# Patient Record
Sex: Male | Born: 1960 | Race: White | Hispanic: No | Marital: Married | Smoking: Never smoker
Health system: Southern US, Community
[De-identification: ages and names within clinical notes are randomized; demographics above are authoritative.]

## PROBLEM LIST (undated history)

## (undated) DIAGNOSIS — G4733 Obstructive sleep apnea (adult) (pediatric): Secondary | ICD-10-CM

## (undated) DIAGNOSIS — N529 Male erectile dysfunction, unspecified: Secondary | ICD-10-CM

## (undated) DIAGNOSIS — E7800 Pure hypercholesterolemia, unspecified: Secondary | ICD-10-CM

## (undated) DIAGNOSIS — F32A Depression, unspecified: Secondary | ICD-10-CM

## (undated) DIAGNOSIS — R5382 Chronic fatigue, unspecified: Secondary | ICD-10-CM

## (undated) DIAGNOSIS — M109 Gout, unspecified: Secondary | ICD-10-CM

## (undated) DIAGNOSIS — K7460 Unspecified cirrhosis of liver: Secondary | ICD-10-CM

## (undated) DIAGNOSIS — J383 Other diseases of vocal cords: Secondary | ICD-10-CM

## (undated) DIAGNOSIS — G8929 Other chronic pain: Secondary | ICD-10-CM

## (undated) DIAGNOSIS — K219 Gastro-esophageal reflux disease without esophagitis: Secondary | ICD-10-CM

## (undated) DIAGNOSIS — F909 Attention-deficit hyperactivity disorder, unspecified type: Secondary | ICD-10-CM

## (undated) DIAGNOSIS — F4310 Post-traumatic stress disorder, unspecified: Secondary | ICD-10-CM

## (undated) DIAGNOSIS — M549 Dorsalgia, unspecified: Secondary | ICD-10-CM

## (undated) HISTORY — DX: Other diseases of vocal cords: J38.3

## (undated) HISTORY — DX: Attention-deficit hyperactivity disorder, unspecified type: F90.9

## (undated) HISTORY — DX: Depression, unspecified: F32.A

## (undated) HISTORY — DX: Obstructive sleep apnea (adult) (pediatric): G47.33

## (undated) HISTORY — PX: LARYNGOSCOPY: SUR817

## (undated) HISTORY — PX: COLONOSCOPY: SHX174

## (undated) HISTORY — DX: Male erectile dysfunction, unspecified: N52.9

## (undated) HISTORY — DX: Chronic fatigue, unspecified: R53.82

## (undated) HISTORY — DX: Post-traumatic stress disorder, unspecified: F43.10

---

## 2007-04-11 ENCOUNTER — Ambulatory Visit (HOSPITAL_COMMUNITY): Admission: RE | Admit: 2007-04-11 | Discharge: 2007-04-11 | Payer: Self-pay | Admitting: Internal Medicine

## 2008-05-11 ENCOUNTER — Encounter: Payer: Self-pay | Admitting: Endocrinology

## 2008-06-15 ENCOUNTER — Encounter: Payer: Self-pay | Admitting: Endocrinology

## 2008-06-21 ENCOUNTER — Ambulatory Visit (HOSPITAL_COMMUNITY): Admission: RE | Admit: 2008-06-21 | Discharge: 2008-06-21 | Payer: Self-pay | Admitting: Pediatrics

## 2008-07-20 DIAGNOSIS — E785 Hyperlipidemia, unspecified: Secondary | ICD-10-CM | POA: Insufficient documentation

## 2008-07-20 DIAGNOSIS — J309 Allergic rhinitis, unspecified: Secondary | ICD-10-CM | POA: Insufficient documentation

## 2008-07-21 ENCOUNTER — Ambulatory Visit: Payer: Self-pay | Admitting: Endocrinology

## 2008-07-21 DIAGNOSIS — E291 Testicular hypofunction: Secondary | ICD-10-CM | POA: Insufficient documentation

## 2008-07-21 DIAGNOSIS — E230 Hypopituitarism: Secondary | ICD-10-CM | POA: Insufficient documentation

## 2009-08-17 ENCOUNTER — Emergency Department (HOSPITAL_COMMUNITY): Admission: EM | Admit: 2009-08-17 | Discharge: 2009-08-18 | Payer: Self-pay | Admitting: Emergency Medicine

## 2014-10-26 ENCOUNTER — Emergency Department (HOSPITAL_COMMUNITY): Payer: Self-pay

## 2014-10-26 ENCOUNTER — Emergency Department (HOSPITAL_COMMUNITY)
Admission: EM | Admit: 2014-10-26 | Discharge: 2014-10-26 | Disposition: A | Discharge status: Home / Self Care | Payer: Self-pay | Attending: Emergency Medicine | Admitting: Emergency Medicine

## 2014-10-26 ENCOUNTER — Encounter (HOSPITAL_COMMUNITY): Payer: Self-pay | Admitting: Emergency Medicine

## 2014-10-26 DIAGNOSIS — Z8719 Personal history of other diseases of the digestive system: Secondary | ICD-10-CM | POA: Insufficient documentation

## 2014-10-26 DIAGNOSIS — R000 Tachycardia, unspecified: Secondary | ICD-10-CM | POA: Insufficient documentation

## 2014-10-26 DIAGNOSIS — G8929 Other chronic pain: Secondary | ICD-10-CM | POA: Insufficient documentation

## 2014-10-26 DIAGNOSIS — Z8639 Personal history of other endocrine, nutritional and metabolic disease: Secondary | ICD-10-CM | POA: Insufficient documentation

## 2014-10-26 DIAGNOSIS — Z8739 Personal history of other diseases of the musculoskeletal system and connective tissue: Secondary | ICD-10-CM | POA: Insufficient documentation

## 2014-10-26 DIAGNOSIS — J40 Bronchitis, not specified as acute or chronic: Secondary | ICD-10-CM

## 2014-10-26 DIAGNOSIS — J209 Acute bronchitis, unspecified: Secondary | ICD-10-CM | POA: Insufficient documentation

## 2014-10-26 HISTORY — DX: Gout, unspecified: M10.9

## 2014-10-26 HISTORY — DX: Other chronic pain: G89.29

## 2014-10-26 HISTORY — DX: Dorsalgia, unspecified: M54.9

## 2014-10-26 HISTORY — DX: Gastro-esophageal reflux disease without esophagitis: K21.9

## 2014-10-26 HISTORY — DX: Pure hypercholesterolemia, unspecified: E78.00

## 2014-10-26 LAB — BASIC METABOLIC PANEL
ANION GAP: 9 (ref 5–15)
BUN: 17 mg/dL (ref 6–20)
CHLORIDE: 103 mmol/L (ref 101–111)
CO2: 28 mmol/L (ref 22–32)
CREATININE: 1.18 mg/dL (ref 0.61–1.24)
Calcium: 8.8 mg/dL — ABNORMAL LOW (ref 8.9–10.3)
GFR calc non Af Amer: >60 mL/min (ref >60)
Glucose, Bld: 132 mg/dL — ABNORMAL HIGH (ref 70–99)
POTASSIUM: 4.3 mmol/L (ref 3.5–5.1)
SODIUM: 140 mmol/L (ref 135–145)

## 2014-10-26 LAB — CBC WITH DIFFERENTIAL/PLATELET
BASOS PCT: 1 % (ref 0–1)
Basophils Absolute: 0.1 10*3/uL (ref 0.0–0.1)
EOS ABS: 0.1 10*3/uL (ref 0.0–0.7)
Eosinophils Relative: 3 % (ref 0–5)
HCT: 43.4 % (ref 39.0–52.0)
HEMOGLOBIN: 15.4 g/dL (ref 13.0–17.0)
LYMPHS ABS: 1.1 10*3/uL (ref 0.7–4.0)
LYMPHS PCT: 26 % (ref 12–46)
MCH: 31 pg (ref 26.0–34.0)
MCHC: 35.5 g/dL (ref 30.0–36.0)
MCV: 87.5 fL (ref 78.0–100.0)
MONOS PCT: 11 % (ref 3–12)
Monocytes Absolute: 0.4 10*3/uL (ref 0.1–1.0)
NEUTROS ABS: 2.4 10*3/uL (ref 1.7–7.7)
NEUTROS PCT: 59 % (ref 43–77)
PLATELETS: 187 10*3/uL (ref 150–400)
RBC: 4.96 MIL/uL (ref 4.22–5.81)
RDW: 12.7 % (ref 11.5–15.5)
WBC: 4.1 10*3/uL (ref 4.0–10.5)

## 2014-10-26 LAB — TROPONIN I: Troponin I: <0.03 ng/mL (ref <0.031)

## 2014-10-26 MED ORDER — PREDNISONE 20 MG PO TABS: 40.0000 mg | ORAL_TABLET | Freq: Every day | ORAL | Status: DC

## 2014-10-26 MED ORDER — ALBUTEROL SULFATE HFA 108 (90 BASE) MCG/ACT IN AERS
2.0000 | INHALATION_SPRAY | RESPIRATORY_TRACT | Status: AC
  Administered 2014-10-26: 2 via RESPIRATORY_TRACT
  Filled 2014-10-26: qty 6.7

## 2014-10-26 MED ORDER — BENZONATATE 100 MG PO CAPS: 100.0000 mg | ORAL_CAPSULE | Freq: Three times a day (TID) | ORAL | Status: DC

## 2014-10-26 MED ORDER — IPRATROPIUM-ALBUTEROL 0.5-2.5 (3) MG/3ML IN SOLN
3.0000 mL | Freq: Once | RESPIRATORY_TRACT | Status: AC
  Administered 2014-10-26: 3 mL via RESPIRATORY_TRACT
  Filled 2014-10-26: qty 3

## 2014-10-26 MED ORDER — BENZONATATE 100 MG PO CAPS
200.0000 mg | ORAL_CAPSULE | Freq: Once | ORAL | Status: AC
  Administered 2014-10-26: 200 mg via ORAL
  Filled 2014-10-26: qty 2

## 2014-10-26 MED ORDER — AZITHROMYCIN 250 MG PO TABS: 250.0000 mg | ORAL_TABLET | Freq: Every day | ORAL | Status: DC

## 2014-10-26 MED ORDER — KETOROLAC TROMETHAMINE 60 MG/2ML IM SOLN
60.0000 mg | Freq: Once | INTRAMUSCULAR | Status: AC
  Administered 2014-10-26: 60 mg via INTRAMUSCULAR
  Filled 2014-10-26: qty 2

## 2014-10-26 NOTE — ED Provider Notes (Signed)
CSN: [id number]     Arrival date & time 10/26/14  0603 History   First MD Initiated Contact with Patient 10/26/14 [phone]     Chief Complaint  Patient presents with  . Chest Pain  . 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 He reports approximately 2 days of cough, chest pain with the cough, fevers chills stuffy nose and a sore throat. He did get a flu shot this year, his cough has been occasionally productive, he has been taking over-the-counter medications with minimal relief.  Patient is a 54 y.o. male presenting with chest pain and shortness of breath. The history is provided by the patient.  Chest Pain Associated symptoms: shortness of breath   Shortness of Breath Associated symptoms: chest pain     Past Medical History  Diagnosis Date  . Hypercholesteremia   . Gout   . GERD (gastroesophageal reflux disease)   . Chronic back pain    History reviewed. No pertinent past surgical history. History reviewed. No pertinent family history. History  Substance Use Topics  . Smoking status: Never Smoker   . Smokeless tobacco: Not on file  . Alcohol Use: No    Review of Systems  Respiratory: Positive for shortness of breath.   Cardiovascular: Positive for chest pain.  All other systems reviewed and are negative.     Allergies  Review of patient's allergies indicates no known allergies.  Home Medications   Prior to Admission medications   Medication Sig Start Date End Date Taking? Authorizing Provider  azithromycin (ZITHROMAX Z-PAK) 250 MG tablet Take 1 tablet (250 mg total) by mouth daily. 500mg  PO day 1, then 250mg  PO days  205 10/26/14   Noemi Chapel, MD  benzonatate (TESSALON) 100 MG capsule Take 1 capsule (100 mg total) by mouth every 8 (eight) hours. 10/26/14   Noemi Chapel, MD  predniSONE (DELTASONE) 20 MG tablet Take 2 tablets (40 mg total) by mouth daily. 10/26/14   Noemi Chapel, MD   BP 141/81 mmHg  Pulse 115  Temp(Src) 98.3 F (36.8 C) (Oral)  Resp 17  Ht 5' 9.5" (1.765 m)  Wt 270 lb (122.471 kg)  BMI 39.31 kg/m2  SpO2 99% Physical Exam  Constitutional: He appears well-developed and well-nourished. No distress.  HENT:  Head: Normocephalic and atraumatic.  Mouth/Throat: Oropharynx is clear and moist. No oropharyngeal exudate.  Oropharynx erythematous, no exudate asymmetry or hypertrophy  Eyes: Conjunctivae and EOM are normal. Pupils are equal, round, and reactive to light. Right eye exhibits no discharge. Left eye exhibits no discharge. No scleral icterus.  Neck: Normal range of motion. Neck supple. No JVD present. No thyromegaly present.  No torticollis or lymphadenopathy  Cardiovascular: Regular rhythm, normal heart sounds and intact distal pulses.  Exam reveals no gallop and no friction rub.   No murmur heard. Mild tachycardia at 105 bpm  Pulmonary/Chest: Effort normal. No respiratory distress. He has wheezes (minimal expiratory wheezing). He has no rales.  Abdominal: Soft. Bowel sounds are normal. He exhibits no distension and no mass. There is no tenderness.  Musculoskeletal: Normal range of motion. He exhibits no edema or tenderness.  No edema o the lower extremities  Lymphadenopathy:    He has no cervical  adenopathy.  Neurological: He is alert. Coordination normal.  Skin: Skin is warm and dry. No rash noted. No erythema.  Psychiatric: He has a normal mood and affect. His behavior is normal.  Nursing note and vitals reviewed.   ED Course  Procedures (including critical care time) Labs Review Labs Reviewed  CBC WITH DIFFERENTIAL/PLATELET  BASIC METABOLIC PANEL  TROPONIN I     Imaging Review Dg Chest 2 View  10/26/2014   CLINICAL DATA:  Sharp chest pain and cough for 2 days.  EXAM: CHEST  2 VIEW  COMPARISON:  Chest radiograph April 11, 2007  FINDINGS: The cardiac silhouette is upper limits of normal in size, mediastinal silhouette is nonsuspicious. Similar bronchitic changes without pleural effusion or focal consolidation. No pneumothorax. Soft tissue planes and included osseous structures are nonsuspicious.  IMPRESSION: Borderline cardiomegaly, similar mild bronchitic changes without focal consolidation.   Electronically Signed   By: Elon Alas   On: 10/26/2014 06:54     EKG Interpretation   Date/Time:  Tuesday Oct 26 2014 06:13:12 EDT Ventricular Rate:  97 PR Interval:  157 QRS Duration: 88 QT Interval:  334 QTC Calculation: 424 R Axis:   -93 Text Interpretation:  Sinus rhythm Left anterior fascicular block Low  voltage, precordial leads Abnormal R-wave progression, late transition No  old tracing to compare Confirmed by KNAPP  MD-I, IVA ([id number]) on 10/26/2014  6:23:25 AM      MDM   Final diagnoses:  Bronchitis    Labs without leukocytosis CXR witho ut infiltrate Improved with neb Home with meds for sx control of bronchitis.  Pt in agreement with plan.  Meds given in ED:  Medications  benzonatate (TESSALON) capsule 200 mg (not administered)  albuterol (PROVENTIL HFA;VENTOLIN HFA) 108 (90 BASE) MCG/ACT inhaler 2 puff (not administered)  ketorolac (TORADOL) injection 60 mg (not administered)  ipratropium-albuterol (DUONEB) 0.5-2.5 (3) MG/3ML nebulizer solution 3 mL (3 mLs Nebulization Given 10/26/14 0651)    New Prescriptions   AZITHROMYCIN (ZITHROMAX Z-PAK) 250 MG TABLET    Take 1 tablet (250 mg total) by mouth daily. 500mg  PO day 1, then 250mg  PO days 205   BENZONATATE (TESSALON) 100 MG CAPSULE    Take 1 capsule (100 mg total) by mouth every 8 (eight) hours.   PREDNISONE (DELTASONE) 20 MG TABLET    Take 2 tablets (40 mg total)  by mouth daily.       Noemi Chapel, MD 10/26/14 [id number]

## 2014-10-26 NOTE — Discharge Instructions (Signed)
2 puffs of albuterol every 4 hours as needed Prednisone once daly for 5 days Tessalon for cough Zithromax  Please call your doctor for a followup appointment within 24-48 hours. When you talk to your doctor please let them know that you were seen in the emergency department and have them acquire all of your records so that they can discuss the findings with you and formulate a treatment plan to fully care for your new and ongoing problems.

## 2014-10-26 NOTE — ED Notes (Signed)
Patient complaining of sharp central chest pains, cough, and shortness of breath that started last night. Reports productive cough with yellow/Alexander Gutierrez sputum.

## 2014-10-26 NOTE — ED Notes (Signed)
MD at bedside. 

## 2019-02-16 ENCOUNTER — Emergency Department (HOSPITAL_COMMUNITY)
Admission: EM | Admit: 2019-02-16 | Discharge: 2019-02-16 | Disposition: A | Discharge status: Home / Self Care | Payer: No Typology Code available for payment source | Attending: Emergency Medicine | Admitting: Emergency Medicine

## 2019-02-16 ENCOUNTER — Encounter (HOSPITAL_COMMUNITY): Payer: Self-pay | Admitting: Emergency Medicine

## 2019-02-16 ENCOUNTER — Other Ambulatory Visit: Payer: Self-pay

## 2019-02-16 DIAGNOSIS — G8929 Other chronic pain: Secondary | ICD-10-CM

## 2019-02-16 DIAGNOSIS — M545 Low back pain: Secondary | ICD-10-CM | POA: Insufficient documentation

## 2019-02-16 DIAGNOSIS — Z79899 Other long term (current) drug therapy: Secondary | ICD-10-CM | POA: Diagnosis not present

## 2019-02-16 MED ORDER — HYDROCODONE-ACETAMINOPHEN 5-325 MG PO TABS: 1.0000 | ORAL_TABLET | Freq: Four times a day (QID) | ORAL | 0 refills | Status: DC | PRN

## 2019-02-16 MED ORDER — CYCLOBENZAPRINE HCL 10 MG PO TABS: 10.0000 mg | ORAL_TABLET | Freq: Two times a day (BID) | ORAL | 0 refills | Status: DC | PRN

## 2019-02-16 NOTE — ED Triage Notes (Signed)
Pt states he was getting out of the shower and was drying off and felt a pop in his back and had sudden pain. Went to chiropractor this morning as he goes every week and pain became worse. Had this happen about a year ago as well.

## 2019-02-16 NOTE — Discharge Instructions (Signed)
Please read instructions below.  You can take ibuprofen every 6 hours as needed for mild to moderate back pain. You can take hydrocodone every 6 hours as needed for more severe back pain. Be aware there is tylenol in this medication. This medication can make you drowsy. Apply ice to your back for 20 minutes at a time.  You can also apply heat if this provides more relief.   You can take Flexeril/cyclobenzaprine every 12 hours as needed for muscle spasm.  Be aware this medication can make you drowsy; do not take while driving or drinking alcohol.   Follow-up with your primary care provider symptoms persist.   Return to ER if new numbness or tingling in your arms or legs, inability to urinate, inability to hold your bowels, or weakness in your extremities.

## 2019-02-16 NOTE — ED Provider Notes (Signed)
[facility] EMERGENCY DEPARTMENT Provider Note   CSN: [id number] Arrival date & time: 02/16/19  1009     History   Chief Complaint Chief Complaint  Patient presents with  . Back Pain    HPI Alexander Gutierrez is a 58 y.o. male with past medical history of chronic back pain, GERD, hyperlipidemia, presenting to the emergency department with bilateral low back pain that began yesterday.  Patient states he has been having intermittent issues with low back pain for multiple years now.  He states sometimes he will just get sharp shooting pains with particular movements.  Sometimes he will have more of a flare of his back pain where it bothers him for multiple days.  He states yesterday he was getting out of the shower and drying off when he felt a popping sensation in his back with a sudden similar pain.  He describes his pain as sharp, shooting, bilateral lower back.  It is worse with movement.  He states it did not feel any different from prior episodes of back pain.  He denies associated abdominal pain, urinary symptoms, bowel or bladder incontinence, saddle paresthesias, fever.     The history is provided by the patient.    Past Medical History:  Diagnosis Date  . Chronic back pain   . GERD (gastroesophageal reflux disease)   . Gout   . Hypercholesteremia     Patient Active Problem List   Diagnosis Date Noted  . 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 HYPOGONADISM 07/21/2008  . HYPERLIPIDEMIA 07/20/2008  . ALLERGIC RHINITIS 07/20/2008    History reviewed. No pertinent surgical history.      Home Medications    Prior to Admission medications   Medication Sig Start Date End Date Taking? Authorizing Provider  azithromycin (ZITHROMAX Z-PAK) 250 MG tablet Take 1 tablet (250 mg total) by mouth daily. 500mg  PO day 1, then 250mg  PO days 205 10/26/14   Noemi Chapel, MD  benzonatate (TESSALON) 100 MG capsule Take 1 capsule (100 mg total) by mouth every 8 (eight) hours. 10/26/14   Noemi Chapel, MD  cyclobenzaprine (FLEXERIL) 10 MG tablet Take 1 tablet (10 mg total) by mouth 2 (two) times daily as needed for muscle spasms. 02/16/19   Robinson, [place] N, PA-C  HYDROcodone-acetaminophen (NORCO/VICODIN) 5-325 MG tablet Take 1-2 tablets by mouth every 6 (six) hours as needed for severe pain. 02/16/19   Robinson, [place] N, PA-C  predniSONE (DELTASONE) 20 MG tablet Take 2 tablets (40 mg total) by mouth daily. 10/26/14   Noemi Chapel, MD    Family History History reviewed. No pertinent family history.  Social History Social History   Tobacco Use  . Smoking status: Never Smoker  Substance Use Topics  . Alcohol use: No  . Drug use: No     Allergies   Patient has no known allergies.   Review of Systems Review of Systems  Constitutional: Negative for fever.  Gastrointestinal: Negative for abdominal pain.  Genitourinary: Negative for difficulty urinating, dysuria and frequency.  Musculoskeletal: Positive for back pain.  All other systems reviewed and are negative.    Physical Exam Updated Vital Signs BP 124/69 (BP Location: Right Arm)   Pulse 79   Temp 98.5 F (36.9 C) (Oral)   Resp 18   Ht 5\' 10"  (1.778 m)   Wt 127 kg   SpO2 98%   BMI 40.18 kg/m   Physical Exam Vitals signs and nursing note reviewed.  Constitutional:      General: He is  not in acute distress.    Appearance: He is well-developed. He is not ill-appearing.  HENT:     Head: Normocephalic and atraumatic.  Eyes:     Conjunctiva/sclera: Conjunctivae normal.  Cardiovascular:     Rate and Rhythm: Normal rate and regular rhythm.  Pulmonary:     Effort: Pulmonary effort is normal. No respiratory distress.     Breath sounds: Normal breath sounds.  Abdominal:     General: Bowel sounds are normal.     Palpations: Abdomen is soft.     Tenderness: There is no abdominal tenderness. There is no guarding or rebound.  Musculoskeletal:     Comments: There is slight tenderness with generalized palpation  of the bilateral lower back.  No skin changes.  No bony step-offs or gross deformities of the spine.  Spontaneous moving all extremities without difficulty.  Skin:    General: Skin is warm.  Neurological:     Mental Status: He is alert.     Comments: Normal tone.  5/5 strength in BUE and BLE including strong and equal grip strength and dorsiflexion/plantar flexion Sensory: Pinprick and light touch normal in BLE extremities.  Gait: normal gait and balance (using a cane for support) CV: distal pulses palpable throughout    Psychiatric:        Behavior: Behavior normal.      ED Treatments / Results  Labs (all labs ordered are listed, but only abnormal results are displayed) Labs Reviewed - No data to display  EKG None  Radiology No results found.  Procedures Procedures (including critical care time)  Medications Ordered in ED Medications - No data to display   Initial Impression / Assessment and Plan / ED Course  I have reviewed the triage vital signs and the nursing notes.  Pertinent labs & imaging results that were available during my care of the patient were reviewed by me and considered in my medical decision making (see chart for details).        Patient with history of intermittent chronic low back pain, presenting with similar back pain flare since yesterday.   No neurological deficits and normal neuro exam.  Patient can walk but states is painful.  No loss of bowel or bladder control.  No concern for cauda equina.  RICE protocol and pain medicine indicated and discussed with patient.   Discussed results, findings, treatment and follow up. Patient advised of return precautions. Patient verbalized understanding and agreed with plan.  [place] Controlled Substance reporting System queried   Final Clinical Impressions(s) / ED Diagnoses   Final diagnoses:  Chronic bilateral low back pain without sciatica    ED Discharge Orders         Ordered     HYDROcodone-acetaminophen (NORCO/VICODIN) 5-325 MG tablet  Every 6 hours PRN     02/16/19 1239    cyclobenzaprine (FLEXERIL) 10 MG tablet  2 times daily PRN     02/16/19 1239           Robinson, [place] N, PA-C 02/16/19 [place], [place], DO 02/19/19 1546

## 2020-03-21 DIAGNOSIS — S338XXA Sprain of other parts of lumbar spine and pelvis, initial encounter: Secondary | ICD-10-CM | POA: Diagnosis not present

## 2020-03-21 DIAGNOSIS — M9902 Segmental and somatic dysfunction of thoracic region: Secondary | ICD-10-CM | POA: Diagnosis not present

## 2020-03-21 DIAGNOSIS — S233XXA Sprain of ligaments of thoracic spine, initial encounter: Secondary | ICD-10-CM | POA: Diagnosis not present

## 2020-03-21 DIAGNOSIS — M9901 Segmental and somatic dysfunction of cervical region: Secondary | ICD-10-CM | POA: Diagnosis not present

## 2020-03-22 DIAGNOSIS — M79674 Pain in right toe(s): Secondary | ICD-10-CM | POA: Diagnosis not present

## 2020-03-22 DIAGNOSIS — L600 Ingrowing nail: Secondary | ICD-10-CM | POA: Diagnosis not present

## 2020-03-22 DIAGNOSIS — L089 Local infection of the skin and subcutaneous tissue, unspecified: Secondary | ICD-10-CM | POA: Diagnosis not present

## 2020-03-29 DIAGNOSIS — M9901 Segmental and somatic dysfunction of cervical region: Secondary | ICD-10-CM | POA: Diagnosis not present

## 2020-03-29 DIAGNOSIS — S233XXA Sprain of ligaments of thoracic spine, initial encounter: Secondary | ICD-10-CM | POA: Diagnosis not present

## 2020-03-29 DIAGNOSIS — S338XXA Sprain of other parts of lumbar spine and pelvis, initial encounter: Secondary | ICD-10-CM | POA: Diagnosis not present

## 2020-03-29 DIAGNOSIS — M9902 Segmental and somatic dysfunction of thoracic region: Secondary | ICD-10-CM | POA: Diagnosis not present

## 2020-04-05 DIAGNOSIS — M9902 Segmental and somatic dysfunction of thoracic region: Secondary | ICD-10-CM | POA: Diagnosis not present

## 2020-04-05 DIAGNOSIS — M9901 Segmental and somatic dysfunction of cervical region: Secondary | ICD-10-CM | POA: Diagnosis not present

## 2020-04-05 DIAGNOSIS — S233XXA Sprain of ligaments of thoracic spine, initial encounter: Secondary | ICD-10-CM | POA: Diagnosis not present

## 2020-04-05 DIAGNOSIS — S338XXA Sprain of other parts of lumbar spine and pelvis, initial encounter: Secondary | ICD-10-CM | POA: Diagnosis not present

## 2020-04-12 DIAGNOSIS — M9901 Segmental and somatic dysfunction of cervical region: Secondary | ICD-10-CM | POA: Diagnosis not present

## 2020-04-12 DIAGNOSIS — S233XXA Sprain of ligaments of thoracic spine, initial encounter: Secondary | ICD-10-CM | POA: Diagnosis not present

## 2020-04-12 DIAGNOSIS — S338XXA Sprain of other parts of lumbar spine and pelvis, initial encounter: Secondary | ICD-10-CM | POA: Diagnosis not present

## 2020-04-12 DIAGNOSIS — M9902 Segmental and somatic dysfunction of thoracic region: Secondary | ICD-10-CM | POA: Diagnosis not present

## 2020-04-19 DIAGNOSIS — S233XXA Sprain of ligaments of thoracic spine, initial encounter: Secondary | ICD-10-CM | POA: Diagnosis not present

## 2020-04-19 DIAGNOSIS — M9902 Segmental and somatic dysfunction of thoracic region: Secondary | ICD-10-CM | POA: Diagnosis not present

## 2020-04-19 DIAGNOSIS — M9901 Segmental and somatic dysfunction of cervical region: Secondary | ICD-10-CM | POA: Diagnosis not present

## 2020-04-19 DIAGNOSIS — S338XXA Sprain of other parts of lumbar spine and pelvis, initial encounter: Secondary | ICD-10-CM | POA: Diagnosis not present

## 2020-04-27 DIAGNOSIS — S338XXA Sprain of other parts of lumbar spine and pelvis, initial encounter: Secondary | ICD-10-CM | POA: Diagnosis not present

## 2020-04-27 DIAGNOSIS — M9902 Segmental and somatic dysfunction of thoracic region: Secondary | ICD-10-CM | POA: Diagnosis not present

## 2020-04-27 DIAGNOSIS — S233XXA Sprain of ligaments of thoracic spine, initial encounter: Secondary | ICD-10-CM | POA: Diagnosis not present

## 2020-04-27 DIAGNOSIS — M9901 Segmental and somatic dysfunction of cervical region: Secondary | ICD-10-CM | POA: Diagnosis not present

## 2020-05-04 DIAGNOSIS — S233XXA Sprain of ligaments of thoracic spine, initial encounter: Secondary | ICD-10-CM | POA: Diagnosis not present

## 2020-05-04 DIAGNOSIS — S338XXA Sprain of other parts of lumbar spine and pelvis, initial encounter: Secondary | ICD-10-CM | POA: Diagnosis not present

## 2020-05-04 DIAGNOSIS — M9902 Segmental and somatic dysfunction of thoracic region: Secondary | ICD-10-CM | POA: Diagnosis not present

## 2020-05-04 DIAGNOSIS — M9901 Segmental and somatic dysfunction of cervical region: Secondary | ICD-10-CM | POA: Diagnosis not present

## 2020-05-11 DIAGNOSIS — S338XXA Sprain of other parts of lumbar spine and pelvis, initial encounter: Secondary | ICD-10-CM | POA: Diagnosis not present

## 2020-05-11 DIAGNOSIS — M9902 Segmental and somatic dysfunction of thoracic region: Secondary | ICD-10-CM | POA: Diagnosis not present

## 2020-05-11 DIAGNOSIS — M9901 Segmental and somatic dysfunction of cervical region: Secondary | ICD-10-CM | POA: Diagnosis not present

## 2020-05-11 DIAGNOSIS — S233XXA Sprain of ligaments of thoracic spine, initial encounter: Secondary | ICD-10-CM | POA: Diagnosis not present

## 2020-05-13 DIAGNOSIS — J209 Acute bronchitis, unspecified: Secondary | ICD-10-CM | POA: Diagnosis not present

## 2020-05-13 DIAGNOSIS — J069 Acute upper respiratory infection, unspecified: Secondary | ICD-10-CM | POA: Diagnosis not present

## 2020-05-16 ENCOUNTER — Encounter (HOSPITAL_COMMUNITY): Payer: Self-pay

## 2020-05-16 ENCOUNTER — Emergency Department (HOSPITAL_COMMUNITY): Payer: No Typology Code available for payment source

## 2020-05-16 ENCOUNTER — Emergency Department (HOSPITAL_COMMUNITY)
Admission: EM | Admit: 2020-05-16 | Discharge: 2020-05-16 | Disposition: A | Discharge status: Home / Self Care | Payer: No Typology Code available for payment source | Attending: Emergency Medicine | Admitting: Emergency Medicine

## 2020-05-16 ENCOUNTER — Other Ambulatory Visit: Payer: Self-pay

## 2020-05-16 DIAGNOSIS — U071 COVID-19: Secondary | ICD-10-CM | POA: Insufficient documentation

## 2020-05-16 DIAGNOSIS — R0602 Shortness of breath: Secondary | ICD-10-CM | POA: Diagnosis present

## 2020-05-16 LAB — BASIC METABOLIC PANEL
Anion gap: 10 (ref 5–15)
BUN: 22 mg/dL — ABNORMAL HIGH (ref 6–20)
CO2: 26 mmol/L (ref 22–32)
Calcium: 8.8 mg/dL — ABNORMAL LOW (ref 8.9–10.3)
Chloride: 100 mmol/L (ref 98–111)
Creatinine, Ser: 1.24 mg/dL (ref 0.61–1.24)
GFR, Estimated: >60 mL/min (ref >60)
Glucose, Bld: 101 mg/dL — ABNORMAL HIGH (ref 70–99)
Potassium: 4.4 mmol/L (ref 3.5–5.1)
Sodium: 136 mmol/L (ref 135–145)

## 2020-05-16 LAB — CBC WITH DIFFERENTIAL/PLATELET
Abs Immature Granulocytes: 0.05 10*3/uL (ref 0.00–0.07)
Basophils Absolute: 0.1 10*3/uL (ref 0.0–0.1)
Basophils Relative: 1 %
Eosinophils Absolute: 0.2 10*3/uL (ref 0.0–0.5)
Eosinophils Relative: 2 %
HCT: 43.3 % (ref 39.0–52.0)
Hemoglobin: 15.1 g/dL (ref 13.0–17.0)
Immature Granulocytes: 1 %
Lymphocytes Relative: 36 %
Lymphs Abs: 2.7 10*3/uL (ref 0.7–4.0)
MCH: 31.1 pg (ref 26.0–34.0)
MCHC: 34.9 g/dL (ref 30.0–36.0)
MCV: 89.3 fL (ref 80.0–100.0)
Monocytes Absolute: 0.7 10*3/uL (ref 0.1–1.0)
Monocytes Relative: 9 %
Neutro Abs: 3.8 10*3/uL (ref 1.7–7.7)
Neutrophils Relative %: 51 %
Platelets: 241 10*3/uL (ref 150–400)
RBC: 4.85 MIL/uL (ref 4.22–5.81)
RDW: 12.4 % (ref 11.5–15.5)
WBC: 7.4 10*3/uL (ref 4.0–10.5)
nRBC: 0 % (ref 0.0–0.2)

## 2020-05-16 LAB — TROPONIN I (HIGH SENSITIVITY): Troponin I (High Sensitivity): 2 ng/L (ref <18)

## 2020-05-16 MED ORDER — ALBUTEROL SULFATE HFA 108 (90 BASE) MCG/ACT IN AERS
2.0000 | INHALATION_SPRAY | RESPIRATORY_TRACT | Status: DC | PRN
  Administered 2020-05-16: 2 via RESPIRATORY_TRACT
  Filled 2020-05-16: qty 6.7

## 2020-05-16 MED ORDER — IOHEXOL 350 MG/ML SOLN
100.0000 mL | Freq: Once | INTRAVENOUS | Status: AC | PRN
  Administered 2020-05-16: 100 mL via INTRAVENOUS

## 2020-05-16 MED ORDER — SODIUM CHLORIDE 0.9 % IV BOLUS
500.0000 mL | Freq: Once | INTRAVENOUS | Status: AC
  Administered 2020-05-16: 500 mL via INTRAVENOUS

## 2020-05-16 MED ORDER — AEROCHAMBER PLUS FLO-VU MISC
1.0000 | Freq: Once | Status: AC
  Administered 2020-05-16: 1
  Filled 2020-05-16: qty 1

## 2020-05-16 NOTE — ED Provider Notes (Signed)
[facility] EMERGENCY DEPARTMENT Provider Note   CSN: [id number] Arrival date & time: 05/16/20  1923     History Chief Complaint  Patient presents with  . Shortness of Breath    [name] is a 59 y.o. male history of obesity, chronic pain, GERD, gout, hypercholesterolemia.  Patient presents to the ER today for concern of pulmonary embolism.  He reports he was diagnosed with COVID-19 with 2 days ago, he began having symptoms of Covid 7 days ago including cough, loss of taste and smell and generalized fatigue.  If he reports that he was seen at the [place] earlier today and received immunoglobulin, he did not have any blood work or imaging performed and he returned home around noon.  He took a nap when he woke up he had a strong coughing fit and noticed large amount of blood clots in his sputum which concerned him.  He reports some shortness of breath with coughing but no shortness of breath when he is not coughing.  Denies fever/chills, headache, neck stiffness, chest pain, abdominal pain, nausea/vomiting/diarrhea, extremity swelling/color change, history of blood clot or any additional concerns.  HPI     Past Medical History:  Diagnosis Date  . Chronic back pain   . GERD (gastroesophageal reflux disease)   . Gout   . Hypercholesteremia     Patient Active Problem List   Diagnosis Date Noted  . 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 HYPOGONADISM 07/21/2008  . HYPERLIPIDEMIA 07/20/2008  . ALLERGIC RHINITIS 07/20/2008    History reviewed. No pertinent surgical history.     History reviewed. No pertinent family history.  Social History   Tobacco Use  . Smoking status: Never Smoker  . Smokeless tobacco: Never Used  Substance Use Topics  . Alcohol use: No  . Drug use: No    Home Medications Prior to Admission medications   Medication Sig Start Date End Date Taking? Authorizing Provider  azithromycin (ZITHROMAX Z-PAK) 250 MG tablet Take 1 tablet (250 mg total) by  mouth daily. 500mg  PO day 1, then 250mg  PO days 205 10/26/14   Noemi Chapel, MD  benzonatate (TESSALON) 100 MG capsule Take 1 capsule (100 mg total) by mouth every 8 (eight) hours. 10/26/14   Noemi Chapel, MD  cyclobenzaprine (FLEXERIL) 10 MG tablet Take 1 tablet (10 mg total) by mouth 2 (two) times daily as needed for muscle spasms. 02/16/19   Robinson, [place] N, PA-C  HYDROcodone-acetaminophen (NORCO/VICODIN) 5-325 MG tablet Take 1-2 tablets by mouth every 6 (six) hours as needed for severe pain. 02/16/19   Robinson, [place] N, PA-C  predniSONE (DELTASONE) 20 MG tablet Take 2 tablets (40 mg total) by mouth daily. 10/26/14   Noemi Chapel, MD    Allergies    Patient has no known allergies.  Review of Systems   Review of Systems Ten systems are reviewed and are negative for acute change except as noted in the HPI  Physical Exam Updated Vital Signs BP 120/77 (BP Location: Right Arm)   Pulse 82   Temp 97.8 F (36.6 C) (Oral)   Resp 18   Ht 5\' 10"  (1.778 m)   Wt 124.7 kg   SpO2 97%   BMI 39.46 kg/m   Physical Exam Constitutional:      General: He is not in acute distress.    Appearance: Normal appearance. He is well-developed. He is not ill-appearing or diaphoretic.  HENT:     Head: Normocephalic and atraumatic.  Eyes:     General: Vision  grossly intact. Gaze aligned appropriately.     Pupils: Pupils are equal, round, and reactive to light.  Neck:     Trachea: Trachea and phonation normal.  Cardiovascular:     Rate and Rhythm: Normal rate and regular rhythm.  Pulmonary:     Effort: Pulmonary effort is normal. No accessory muscle usage or respiratory distress.     Breath sounds: Normal breath sounds.  Abdominal:     General: There is no distension.     Palpations: Abdomen is soft.     Tenderness: There is no abdominal tenderness. There is no guarding or rebound.  Musculoskeletal:        General: Normal range of motion.     Cervical back: Normal range of motion.     Right  lower leg: No tenderness. No edema.     Left lower leg: No tenderness. No edema.  Skin:    General: Skin is warm and dry.  Neurological:     Mental Status: He is alert.     GCS: GCS eye subscore is 4. GCS verbal subscore is 5. GCS motor subscore is 6.     Comments: Speech is clear and goal oriented, follows commands Major Cranial nerves without deficit, no facial droop Moves extremities without ataxia, coordination intact  Psychiatric:        Behavior: Behavior normal.     ED Results / Procedures / Treatments   Labs (all labs ordered are listed, but only abnormal results are displayed) Labs Reviewed  BASIC METABOLIC PANEL - Abnormal; Notable for the following components:      Result Value   Glucose, Bld 101 (*)    BUN 22 (*)    Calcium 8.8 (*)    All other components within normal limits  CBC WITH DIFFERENTIAL/PLATELET  CBC WITH DIFFERENTIAL/PLATELET  TROPONIN I (HIGH SENSITIVITY)    EKG EKG Interpretation  Date/Time:  Monday May 16 2020 20:54:00 EST Ventricular Rate:  70 PR Interval:  178 QRS Duration: 86 QT Interval:  406 QTC Calculation: 438 R Axis:   -107 Text Interpretation: Normal sinus rhythm Right superior axis deviation Low voltage QRS Abnormal ECG Since last tracing QRS voltage is lower Confirmed by Calvert Cantor [phone]) on 05/16/2020 9:09:08 PM   Radiology CT Angio Chest PE W and/or Wo Contrast  Result Date: 05/16/2020 CLINICAL DATA:  Shortness of breath, possible COVID-19, presenting from the [place] following hemoptysis EXAM: CT ANGIOGRAPHY CHEST WITH CONTRAST TECHNIQUE: Multidetector CT imaging of the chest was performed using the standard protocol during bolus administration of intravenous contrast. Multiplanar CT image reconstructions and MIPs were obtained to evaluate the vascular anatomy. CONTRAST:  124mL OMNIPAQUE IOHEXOL 350 MG/ML SOLN COMPARISON:  Radiograph 05/16/2020 FINDINGS: Cardiovascular: Satisfactory opacification the pulmonary arteries to  the segmental level. No pulmonary artery filling defects are identified. Central pulmonary arteries are normal caliber. Normal heart size. No pericardial effusion. The aorta is normal caliber. Normal 3 vessel branching of the aortic arch. Proximal great vessels are unremarkable. Upper abdominal branches are unremarkable aside from a diminutive accessory left upper pole renal artery. Mediastinum/Nodes: No mediastinal fluid or gas. Normal thyroid gland and thoracic inlet. No acute abnormality of the trachea or esophagus. No worrisome mediastinal, hilar or axillary adenopathy. Lungs/Pleura: Mild airways thickening is seen diffusely. No consolidation, features of edema, pneumothorax, or effusion. No suspicious pulmonary nodules or masses. Upper Abdomen: 1.7 cm fluid attenuation cyst seen in the anterior left lobe liver. No concerning or worrisome features. Diffuse hepatic hypoattenuation compatible with  hepatic steatosis. Sparing along the gallbladder fossa. Mild bilateral perinephric stranding, nonspecific. Musculoskeletal: No acute osseous abnormality or suspicious osseous lesion. Multilevel degenerative changes are present in the imaged portions of the spine. Mild degenerative changes in the bilateral shoulders. No worrisome chest wall masses. Mild bilateral gynecomastia. Review of the MIP images confirms the above findings. IMPRESSION: 1. No evidence of pulmonary embolism. 2. Mild airways thickening, could reflect acute or chronic bronchitis versus reactive airways disease. 3. No other acute intrathoracic process. 4. Hepatic steatosis. 5. Mild bilateral perinephric stranding, nonspecific and possibly related to age or diminished renal function though can be seen in the setting of urinary tract infection. Correlate for urinary symptoms and consider urinalysis as clinically warranted. Electronically Signed   By: Lovena Le M.D.   On: 05/16/2020 22:56   DG Chest Port 1 View  Result Date: 05/16/2020 CLINICAL  DATA:  Short of breath, cough, mop CIS, COVID-19 positive EXAM: PORTABLE CHEST 1 VIEW COMPARISON:  10/26/2014 FINDINGS: The heart size and mediastinal contours are within normal limits. Both lungs are clear. The visualized skeletal structures are unremarkable. IMPRESSION: No active disease. Electronically Signed   By: Randa Ngo M.D.   On: 05/16/2020 20:26    Procedures Procedures (including critical care time)  Medications Ordered in ED Medications  albuterol (VENTOLIN HFA) 108 (90 Base) MCG/ACT inhaler 2 puff (has no administration in time range)  aerochamber plus with mask device 1 each (has no administration in time range)  sodium chloride 0.9 % bolus 500 mL (500 mLs Intravenous New Bag/Given (Non-Interop) 05/16/20 2202)  iohexol (OMNIPAQUE) 350 MG/ML injection 100 mL (100 mLs Intravenous Contrast Given 05/16/20 2236)    ED Course  I have reviewed the triage vital signs and the nursing notes.  Pertinent labs & imaging results that were available during my care of the patient were reviewed by me and considered in my medical decision making (see chart for details).    MDM Rules/Calculators/A&P                         Additional history obtained from: 1. Nursing notes from this visit. 2. Review of electronic medical records.  [facility]. ---------------------- 59 year old male presented today for concern of shortness of breath and hemoptysis.  He is on day 7 of COVID-19 viral infection diagnosed at [facility] 2 days ago.  A he reports a large amount of blood in his sputum after receiving the immunoglobulin therapy at the [place] today.  He has no chest pain tachycardia or hypoxia on room air.  Patient is very concerned about possible PE and is requesting imaging today, shared decision making made with patient, given he is Covid positive and with hemoptysis today will obtain CT angio PE study. - I ordered, reviewed and interpreted labs which include: CBC without  leukocytosis to suggest bacterial infection, no anemia. High-sensitivity troponin within normal limits, as patient is without chest pain and symptoms have been ongoing for many days no indication for delta troponin. BMP shows no emergent electrolyte derangement, AKI or gap.  Chest x-ray IMPRESSION:  No active disease.   CT Angio PE Study:  IMPRESSION:  1. No evidence of pulmonary embolism.  2. Mild airways thickening, could reflect acute or chronic  bronchitis versus reactive airways disease.  3. No other acute intrathoracic process.  4. Hepatic steatosis.  5. Mild bilateral perinephric stranding, nonspecific and possibly  related to age or diminished renal function though can be  seen in  the setting of urinary tract infection. Correlate for urinary  symptoms and consider urinalysis as clinically warranted.   EKG: Normal sinus rhythm Right superior axis deviation Low voltage QRS Abnormal ECG Since last tracing QRS voltage is lower Confirmed by Calvert Cantor [phone]) on 05/16/2020 9:09:08 PM - Patient ambulated by nursing staff without hypoxia on room air.  Patient reassessed he is resting comfortably no acute distress reports he is feeling well and is requesting discharge.  Patient updated on findings above including incidentals and states understanding and will follow up with his PCP.  Patient was given albuterol inhaler and spacer for likely bronchitis, no indication for further work-up or treatment at this time.  He received immunoglobulin earlier today by the [place].  Advised strict return precautions, advised patient obtain a home pulse oximeter and monitor his symptoms closely.  At this time there does not appear to be any evidence of an acute emergency medical condition and the patient appears stable for discharge with appropriate outpatient follow up. Diagnosis was discussed with patient who verbalizes understanding of care plan and is agreeable to discharge. I have discussed return  precautions with patient who verbalizes understanding. Patient encouraged to follow-up with their PCP. All questions answered.  Patient's case discussed with Dr. Karle Starch who agrees with plan to discharge with follow-up.   Alexander Gutierrez was evaluated in Emergency Department on 05/16/2020 for the symptoms described in the history of present illness. He was evaluated in the context of the global COVID-19 pandemic, which necessitated consideration that the patient might be at risk for infection with the SARS-CoV-2 virus that causes COVID-19. Institutional protocols and algorithms that pertain to the evaluation of patients at risk for COVID-19 are in a state of rapid change based on information released by regulatory bodies including the CDC and federal and state organizations. These policies and algorithms were followed during the patient's care in the ED.  Note: Portions of this report may have been transcribed using voice recognition software. Every effort was made to ensure accuracy; however, inadvertent computerized transcription errors may still be present. Final Clinical Impression(s) / ED Diagnoses Final diagnoses:  [id number]    Rx / DC Orders ED Discharge Orders    None       Gari Crown 05/16/20 2307    Truddie Hidden, MD 05/17/20 [phone]

## 2020-05-16 NOTE — ED Notes (Signed)
97% after ambulating  

## 2020-05-16 NOTE — ED Triage Notes (Signed)
Pt to er, pt states that he is here for covid, states that he was seen at the [place] earlier today and given a treatment, states that he is here because after he coughed up some blood, states that he was vaccinated.  Pt states that he has some sob, pt talking in full sentences.

## 2020-05-16 NOTE — Discharge Instructions (Addendum)
At this time there does not appear to be the presence of an emergent medical condition, however there is always the potential for conditions to change. Please read and follow the below instructions.  Please return to the Emergency Department immediately for any new or worsening symptoms. Please be sure to follow up with your Primary Care Provider within one week regarding your visit today; please call their office to schedule an appointment even if you are feeling better for a follow-up visit. You may use the albuterol inhaler and spacer given to you today as needed for any wheezing and shortness of breath you experience.  If you feel this medication is not helping return to the ER immediately for evaluation. You may buy a pulse oximeter from any drugstore or from [employer] and monitor your oxygen levels at home.  If ever drops below 90% please return to the emergency department for evaluation. Your CT scan today showed some thickening of your airways which is likely due to your infection today.  It also showed hepatic steatosis and mild bilateral perinephric stranding, please discuss these incidental findings with your primary care provider at your follow-up visit.  Go to the nearest Emergency Department immediately if: You have fever or chills You have trouble breathing. You have pain or pressure in your chest. You have confusion. You have bluish lips and fingernails. You have difficulty waking from sleep. You have any new/concerning or worsening of symptoms These symptoms may represent a serious problem that is an emergency. Do not wait to see if the symptoms will go away. Get medical help right away. Call your local emergency services (911 in the [place] Do not drive yourself to the hospital. Let the emergency medical personnel know if you think you have COVID-19.  Please read the additional information packets attached to your discharge summary.  Do not take your medicine if  develop an itchy  rash, swelling in your mouth or lips, or difficulty breathing; call 911 and seek immediate emergency medical attention if this occurs.  You may review your lab tests and imaging results in their entirety on your MyChart account.  Please discuss all results of fully with your primary care provider and other specialist at your follow-up visit.  Note: Portions of this text may have been transcribed using voice recognition software. Every effort was made to ensure accuracy; however, inadvertent computerized transcription errors may still be present.

## 2020-07-13 DIAGNOSIS — M9902 Segmental and somatic dysfunction of thoracic region: Secondary | ICD-10-CM | POA: Diagnosis not present

## 2020-07-13 DIAGNOSIS — M9901 Segmental and somatic dysfunction of cervical region: Secondary | ICD-10-CM | POA: Diagnosis not present

## 2020-07-13 DIAGNOSIS — S338XXA Sprain of other parts of lumbar spine and pelvis, initial encounter: Secondary | ICD-10-CM | POA: Diagnosis not present

## 2020-07-13 DIAGNOSIS — S233XXA Sprain of ligaments of thoracic spine, initial encounter: Secondary | ICD-10-CM | POA: Diagnosis not present

## 2020-08-09 DIAGNOSIS — M9901 Segmental and somatic dysfunction of cervical region: Secondary | ICD-10-CM | POA: Diagnosis not present

## 2020-08-09 DIAGNOSIS — S233XXA Sprain of ligaments of thoracic spine, initial encounter: Secondary | ICD-10-CM | POA: Diagnosis not present

## 2020-08-09 DIAGNOSIS — S338XXA Sprain of other parts of lumbar spine and pelvis, initial encounter: Secondary | ICD-10-CM | POA: Diagnosis not present

## 2020-08-09 DIAGNOSIS — M9902 Segmental and somatic dysfunction of thoracic region: Secondary | ICD-10-CM | POA: Diagnosis not present

## 2020-09-01 DIAGNOSIS — M9902 Segmental and somatic dysfunction of thoracic region: Secondary | ICD-10-CM | POA: Diagnosis not present

## 2020-09-01 DIAGNOSIS — S233XXA Sprain of ligaments of thoracic spine, initial encounter: Secondary | ICD-10-CM | POA: Diagnosis not present

## 2020-09-01 DIAGNOSIS — M9901 Segmental and somatic dysfunction of cervical region: Secondary | ICD-10-CM | POA: Diagnosis not present

## 2020-09-01 DIAGNOSIS — S338XXA Sprain of other parts of lumbar spine and pelvis, initial encounter: Secondary | ICD-10-CM | POA: Diagnosis not present

## 2020-09-29 DIAGNOSIS — S338XXA Sprain of other parts of lumbar spine and pelvis, initial encounter: Secondary | ICD-10-CM | POA: Diagnosis not present

## 2020-09-29 DIAGNOSIS — M9901 Segmental and somatic dysfunction of cervical region: Secondary | ICD-10-CM | POA: Diagnosis not present

## 2020-09-29 DIAGNOSIS — M9902 Segmental and somatic dysfunction of thoracic region: Secondary | ICD-10-CM | POA: Diagnosis not present

## 2020-09-29 DIAGNOSIS — S233XXA Sprain of ligaments of thoracic spine, initial encounter: Secondary | ICD-10-CM | POA: Diagnosis not present

## 2020-10-26 ENCOUNTER — Encounter (HOSPITAL_COMMUNITY): Payer: Self-pay | Admitting: Emergency Medicine

## 2020-10-26 ENCOUNTER — Emergency Department (HOSPITAL_COMMUNITY)
Admission: EM | Admit: 2020-10-26 | Discharge: 2020-10-26 | Disposition: A | Discharge status: Home / Self Care | Payer: No Typology Code available for payment source | Attending: Emergency Medicine | Admitting: Emergency Medicine

## 2020-10-26 ENCOUNTER — Other Ambulatory Visit: Payer: Self-pay

## 2020-10-26 ENCOUNTER — Emergency Department (HOSPITAL_COMMUNITY): Payer: No Typology Code available for payment source

## 2020-10-26 DIAGNOSIS — K219 Gastro-esophageal reflux disease without esophagitis: Secondary | ICD-10-CM | POA: Diagnosis not present

## 2020-10-26 DIAGNOSIS — R079 Chest pain, unspecified: Secondary | ICD-10-CM | POA: Insufficient documentation

## 2020-10-26 LAB — BASIC METABOLIC PANEL
Anion gap: 9 (ref 5–15)
BUN: 17 mg/dL (ref 6–20)
CO2: 26 mmol/L (ref 22–32)
Calcium: 9.2 mg/dL (ref 8.9–10.3)
Chloride: 102 mmol/L (ref 98–111)
Creatinine, Ser: 1.29 mg/dL — ABNORMAL HIGH (ref 0.61–1.24)
GFR, Estimated: >60 mL/min (ref >60)
Glucose, Bld: 133 mg/dL — ABNORMAL HIGH (ref 70–99)
Potassium: 4 mmol/L (ref 3.5–5.1)
Sodium: 137 mmol/L (ref 135–145)

## 2020-10-26 LAB — CBC
HCT: 45.4 % (ref 39.0–52.0)
Hemoglobin: 15.7 g/dL (ref 13.0–17.0)
MCH: 30.8 pg (ref 26.0–34.0)
MCHC: 34.6 g/dL (ref 30.0–36.0)
MCV: 89.2 fL (ref 80.0–100.0)
Platelets: 239 10*3/uL (ref 150–400)
RBC: 5.09 MIL/uL (ref 4.22–5.81)
RDW: 12.4 % (ref 11.5–15.5)
WBC: 7 10*3/uL (ref 4.0–10.5)
nRBC: 0 % (ref 0.0–0.2)

## 2020-10-26 LAB — TROPONIN I (HIGH SENSITIVITY): Troponin I (High Sensitivity): 3 ng/L (ref <18)

## 2020-10-26 NOTE — ED Triage Notes (Signed)
Pt c/o chest pain for the past few days. Yesterday he began to have pain in left arm and shoulder blade. Pt denies cardiac hx.

## 2020-10-26 NOTE — Discharge Instructions (Addendum)
You were evaluated in the Emergency Department and after careful evaluation, we did not find any emergent condition requiring admission or further testing in the hospital.  Your exam/testing today was overall reassuring.  Your blood work did not show any signs of heart damage the pain in her back seems to be due to a muscular strain or spasm.  Recommend Tylenol or Motrin for discomfort.  Please return to the Emergency Department if you experience any worsening of your condition.  Thank you for allowing [place] to be a part of your care.

## 2020-10-26 NOTE — ED Provider Notes (Signed)
[facility] Emergency Department Provider Note MRN:  [id number]  Arrival date & time: 10/26/20     Chief Complaint   Chest Pain   History of Present Illness   Alexander Gutierrez is a 60 y.o. year-old male with a history of GERD presenting to the ED with chief complaint of chest pain.  Patient has been experiencing central chest burning which she attributes to GERD on and off for the past week.  Yesterday at about 3 PM began having a sharp left shoulder blade pain, worse with certain movements or positions.  Was evaluated at the [facility] and told to come to the emergency department for a checkup of his heart.  Denies any dizziness or diaphoresis, no nausea or vomiting, no shortness of breath, no abdominal pain, no known heart issues, no other complaints symptoms are constant, mild.  Review of Systems  A complete 10 system review of systems was obtained and all systems are negative except as noted in the HPI and PMH.   Patient's Health History    Past Medical History:  Diagnosis Date  . Chronic back pain   . GERD (gastroesophageal reflux disease)   . Gout   . Hypercholesteremia     History reviewed. No pertinent surgical history.  History reviewed. No pertinent family history.  Social History   Socioeconomic History  . Marital status: Married    Spouse name: Not on file  . Number of children: Not on file  . Years of education: Not on file  . Highest education level: Not on file  Occupational History  . Not on file  Tobacco Use  . Smoking status: Never Smoker  . Smokeless tobacco: Never Used  Substance and Sexual Activity  . Alcohol use: No  . Drug use: No  . Sexual activity: Not on file  Other Topics Concern  . Not on file  Social History Narrative  . Not on file   Social Determinants of Health   Financial Resource Strain: Not on file  Food Insecurity: Not on file  Transportation Needs: Not on file  Physical Activity: Not on file  Stress: Not on  file  Social Connections: Not on file  Intimate Partner Violence: Not on file     Physical Exam   Vitals:   10/26/20 0600 10/26/20 0630  BP: 118/84 100/60  Pulse: 90 83  Resp: 15 19  Temp:    SpO2: 95% 95%    CONSTITUTIONAL: Well-appearing, NAD NEURO:  Alert and oriented x 3, no focal deficits EYES:  eyes equal and reactive ENT/NECK:  no LAD, no JVD CARDIO: Regular rate, well-perfused, normal S1 and S2 PULM:  CTAB no wheezing or rhonchi GI/GU:  normal bowel sounds, non-distended, non-tender MSK/SPINE:  No gross deformities, no edema SKIN:  no rash, atraumatic PSYCH:  Appropriate speech and behavior  *Additional and/or pertinent findings included in MDM below  Diagnostic and Interventional Summary    EKG Interpretation  Date/Time:  Wednesday Oct 26 2020 05:52:05 EDT Ventricular Rate:  85 PR Interval:  174 QRS Duration: 94 QT Interval:  370 QTC Calculation: 440 R Axis:   200 Text Interpretation: Sinus rhythm Right axis deviation Low voltage, precordial leads Abnormal R-wave progression, late transition No significant change was found Confirmed by Gerlene Fee [phone]) on 10/26/2020 5:54:48 AM      Labs Reviewed  BASIC METABOLIC PANEL - Abnormal; Notable for the following components:      Result Value   Glucose, Bld 133 (*)  Creatinine, Ser 1.29 (*)    All other components within normal limits  CBC  TROPONIN I (HIGH SENSITIVITY)    DG Chest 2 View  Final Result      Medications - No data to display   Procedures  /  Critical Care Procedures  ED Course and Medical Decision Making  I have reviewed the triage vital signs, the nursing notes, and pertinent available records from the EMR.  Listed above are laboratory and imaging tests that I personally ordered, reviewed, and interpreted and then considered in my medical decision making (see below for details).  Atypical chest pain, favoring a muscular pain in the back as it is tender to palpation and worse  with certain positions.  Favoring GERD for this week long burning pain.  Normal blood pressure, equal pulses, doubt dissection.  EKG is reassuring, will check single troponin to exclude signs of ischemia.  Doubt ACS.  Anticipating discharge.       Barth Kirks. Sedonia Small, [place] [email or web address]  Final Clinical Impressions(s) / ED Diagnoses     ICD-10-CM   1. Chest pain, unspecified type  R07.9     ED Discharge Orders    None       Discharge Instructions Discussed with and Provided to Patient:     Discharge Instructions     You were evaluated in the Emergency Department and after careful evaluation, we did not find any emergent condition requiring admission or further testing in the hospital.  Your exam/testing today was overall reassuring.  Your blood work did not show any signs of heart damage the pain in her back seems to be due to a muscular strain or spasm.  Recommend Tylenol or Motrin for discomfort.  Please return to the Emergency Department if you experience any worsening of your condition.  Thank you for allowing [place] to be a part of your care.        Maudie Flakes, MD 10/26/20 [phone]

## 2020-10-27 DIAGNOSIS — S338XXA Sprain of other parts of lumbar spine and pelvis, initial encounter: Secondary | ICD-10-CM | POA: Diagnosis not present

## 2020-10-27 DIAGNOSIS — M9901 Segmental and somatic dysfunction of cervical region: Secondary | ICD-10-CM | POA: Diagnosis not present

## 2020-10-27 DIAGNOSIS — S233XXA Sprain of ligaments of thoracic spine, initial encounter: Secondary | ICD-10-CM | POA: Diagnosis not present

## 2020-10-27 DIAGNOSIS — M9902 Segmental and somatic dysfunction of thoracic region: Secondary | ICD-10-CM | POA: Diagnosis not present

## 2020-11-10 DIAGNOSIS — M9902 Segmental and somatic dysfunction of thoracic region: Secondary | ICD-10-CM | POA: Diagnosis not present

## 2020-11-10 DIAGNOSIS — S233XXA Sprain of ligaments of thoracic spine, initial encounter: Secondary | ICD-10-CM | POA: Diagnosis not present

## 2020-11-10 DIAGNOSIS — M9901 Segmental and somatic dysfunction of cervical region: Secondary | ICD-10-CM | POA: Diagnosis not present

## 2020-11-10 DIAGNOSIS — S338XXA Sprain of other parts of lumbar spine and pelvis, initial encounter: Secondary | ICD-10-CM | POA: Diagnosis not present

## 2020-11-24 DIAGNOSIS — M9902 Segmental and somatic dysfunction of thoracic region: Secondary | ICD-10-CM | POA: Diagnosis not present

## 2020-11-24 DIAGNOSIS — S233XXA Sprain of ligaments of thoracic spine, initial encounter: Secondary | ICD-10-CM | POA: Diagnosis not present

## 2020-11-24 DIAGNOSIS — M9901 Segmental and somatic dysfunction of cervical region: Secondary | ICD-10-CM | POA: Diagnosis not present

## 2020-11-24 DIAGNOSIS — S338XXA Sprain of other parts of lumbar spine and pelvis, initial encounter: Secondary | ICD-10-CM | POA: Diagnosis not present

## 2020-12-08 DIAGNOSIS — M9902 Segmental and somatic dysfunction of thoracic region: Secondary | ICD-10-CM | POA: Diagnosis not present

## 2020-12-08 DIAGNOSIS — M9901 Segmental and somatic dysfunction of cervical region: Secondary | ICD-10-CM | POA: Diagnosis not present

## 2020-12-08 DIAGNOSIS — S338XXA Sprain of other parts of lumbar spine and pelvis, initial encounter: Secondary | ICD-10-CM | POA: Diagnosis not present

## 2020-12-08 DIAGNOSIS — S233XXA Sprain of ligaments of thoracic spine, initial encounter: Secondary | ICD-10-CM | POA: Diagnosis not present

## 2020-12-15 DIAGNOSIS — R0600 Dyspnea, unspecified: Secondary | ICD-10-CM | POA: Diagnosis not present

## 2020-12-15 DIAGNOSIS — M109 Gout, unspecified: Secondary | ICD-10-CM | POA: Diagnosis not present

## 2020-12-15 DIAGNOSIS — R600 Localized edema: Secondary | ICD-10-CM | POA: Diagnosis not present

## 2020-12-15 DIAGNOSIS — G4730 Sleep apnea, unspecified: Secondary | ICD-10-CM | POA: Diagnosis not present

## 2020-12-16 DIAGNOSIS — J209 Acute bronchitis, unspecified: Secondary | ICD-10-CM | POA: Diagnosis not present

## 2020-12-16 DIAGNOSIS — J069 Acute upper respiratory infection, unspecified: Secondary | ICD-10-CM | POA: Diagnosis not present

## 2020-12-22 DIAGNOSIS — S233XXA Sprain of ligaments of thoracic spine, initial encounter: Secondary | ICD-10-CM | POA: Diagnosis not present

## 2020-12-22 DIAGNOSIS — S338XXA Sprain of other parts of lumbar spine and pelvis, initial encounter: Secondary | ICD-10-CM | POA: Diagnosis not present

## 2020-12-22 DIAGNOSIS — M9901 Segmental and somatic dysfunction of cervical region: Secondary | ICD-10-CM | POA: Diagnosis not present

## 2020-12-22 DIAGNOSIS — M9902 Segmental and somatic dysfunction of thoracic region: Secondary | ICD-10-CM | POA: Diagnosis not present

## 2021-01-02 ENCOUNTER — Ambulatory Visit: Payer: PPO | Admitting: Internal Medicine

## 2021-01-02 ENCOUNTER — Other Ambulatory Visit: Payer: Self-pay | Admitting: Internal Medicine

## 2021-01-02 ENCOUNTER — Encounter: Payer: Self-pay | Admitting: Internal Medicine

## 2021-01-02 ENCOUNTER — Other Ambulatory Visit: Payer: Self-pay

## 2021-01-02 DIAGNOSIS — R058 Other specified cough: Secondary | ICD-10-CM | POA: Insufficient documentation

## 2021-01-02 MED ORDER — PANTOPRAZOLE SODIUM 40 MG PO TBEC: 40.0000 mg | DELAYED_RELEASE_TABLET | Freq: Every day | ORAL | 2 refills | Status: DC

## 2021-01-02 MED ORDER — FAMOTIDINE 20 MG PO TABS: ORAL_TABLET | ORAL | 11 refills | Status: DC

## 2021-01-02 MED ORDER — TRAMADOL HCL 50 MG PO TABS: 50.0000 mg | ORAL_TABLET | ORAL | 0 refills | Status: DC | PRN

## 2021-01-02 MED ORDER — TRAMADOL HCL 50 MG PO TABS
50.0000 mg | ORAL_TABLET | Freq: Four times a day (QID) | ORAL | 0 refills | Status: DC | PRN
Start: 2021-01-02 — End: 2021-01-02

## 2021-01-02 MED ORDER — PREDNISONE 10 MG PO TABS: ORAL_TABLET | ORAL | 0 refills | Status: DC

## 2021-01-02 NOTE — Patient Instructions (Addendum)
The key to effective treatment for your cough is eliminating the non-stop cycle of cough you're stuck in long enough to let your airway heal completely and then see if there is anything still making you cough once you stop the cough suppression, but this should take no more than 5 days to figure out  First take delsym two tsp every 12 hours and supplement if needed with  tramadol 50 mg up to 2 every 4 hours to suppress the urge to cough at all or even clear your throat. Swallowing water or using ice chips/non mint and menthol containing candies (such as lifesavers or sugarless jolly ranchers) are also effective.  You should rest your voice and avoid activities that you know make you cough.  Once you have eliminated the cough for 3 straight days try reducing the tramadol first,  then the delsym as tolerated.    Prednisone 10 mg take  4 each am x 2 days,   2 each am x 2 days,  1 each am x 2 days and stop (this is to eliminate allergies and inflammation from coughing)  Protonix (pantoprazole) Take 30-60 min before first meal of the day and Pepcid 20 mg one bedtime plus chlorpheniramine 4 mg x 2 at bedtime (both available over the counter)  until cough is completely gone for at least a week without the need for cough suppression  GERD (REFLUX)  is an extremely common cause of respiratory symptoms, many times with no significant heartburn at all.    It can be treated with medication, but also with lifestyle changes including avoidance of late meals, excessive alcohol, smoking cessation, and avoid fatty foods, chocolate, peppermint, colas, red wine, and acidic juices such as orange juice.  NO MINT OR MENTHOL PRODUCTS SO NO COUGH DROPS  USE HARD CANDY INSTEAD (jolley ranchers or Stover's or Lifesavers (all available in sugarless versions) NO OIL BASED VITAMINS - use powdered substitutes.  Please remember to go to the lab department for your tests - we will call you with the results when they are  available.  We will call for sinus CT    Please schedule a follow up office visit in 4 weeks, sooner if needed                rffr
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
## 2021-01-05 DIAGNOSIS — S233XXA Sprain of ligaments of thoracic spine, initial encounter: Secondary | ICD-10-CM | POA: Diagnosis not present

## 2021-01-05 DIAGNOSIS — S338XXA Sprain of other parts of lumbar spine and pelvis, initial encounter: Secondary | ICD-10-CM | POA: Diagnosis not present

## 2021-01-05 DIAGNOSIS — M9901 Segmental and somatic dysfunction of cervical region: Secondary | ICD-10-CM | POA: Diagnosis not present

## 2021-01-05 DIAGNOSIS — M9902 Segmental and somatic dysfunction of thoracic region: Secondary | ICD-10-CM | POA: Diagnosis not present

## 2021-01-05 LAB — CBC WITH DIFFERENTIAL/PLATELET
Basophils Absolute: 0.1 10*3/uL (ref 0.0–0.2)
Basos: 1 %
EOS (ABSOLUTE): 0.2 10*3/uL (ref 0.0–0.4)
Eos: 2 %
Hematocrit: 45 % (ref 37.5–51.0)
Hemoglobin: 15.3 g/dL (ref 13.0–17.7)
Immature Grans (Abs): 0 10*3/uL (ref 0.0–0.1)
Immature Granulocytes: 0 %
Lymphocytes Absolute: 2.2 10*3/uL (ref 0.7–3.1)
Lymphs: 32 %
MCH: 30.7 pg (ref 26.6–33.0)
MCHC: 34 g/dL (ref 31.5–35.7)
MCV: 90 fL (ref 79–97)
Monocytes Absolute: 0.7 10*3/uL (ref 0.1–0.9)
Monocytes: 9 %
Neutrophils Absolute: 3.8 10*3/uL (ref 1.4–7.0)
Neutrophils: 56 %
Platelets: 233 10*3/uL (ref 150–450)
RBC: 4.99 x10E6/uL (ref 4.14–5.80)
RDW: 13.2 % (ref 11.6–15.4)
WBC: 6.9 10*3/uL (ref 3.4–10.8)

## 2021-01-05 LAB — IGE: IgE (Immunoglobulin E), Serum: 40 IU/mL (ref 6–495)

## 2021-01-10 NOTE — Progress Notes (Signed)
Spoke with pt and notified of results per Dr. Wert. Pt verbalized understanding and denied any questions. 

## 2021-01-17 DIAGNOSIS — S233XXA Sprain of ligaments of thoracic spine, initial encounter: Secondary | ICD-10-CM | POA: Diagnosis not present

## 2021-01-17 DIAGNOSIS — M9901 Segmental and somatic dysfunction of cervical region: Secondary | ICD-10-CM | POA: Diagnosis not present

## 2021-01-17 DIAGNOSIS — S338XXA Sprain of other parts of lumbar spine and pelvis, initial encounter: Secondary | ICD-10-CM | POA: Diagnosis not present

## 2021-01-17 DIAGNOSIS — M9902 Segmental and somatic dysfunction of thoracic region: Secondary | ICD-10-CM | POA: Diagnosis not present

## 2021-01-18 DIAGNOSIS — M109 Gout, unspecified: Secondary | ICD-10-CM | POA: Diagnosis not present

## 2021-01-18 DIAGNOSIS — Z0000 Encounter for general adult medical examination without abnormal findings: Secondary | ICD-10-CM | POA: Diagnosis not present

## 2021-01-18 DIAGNOSIS — N401 Enlarged prostate with lower urinary tract symptoms: Secondary | ICD-10-CM | POA: Diagnosis not present

## 2021-01-19 DIAGNOSIS — M9901 Segmental and somatic dysfunction of cervical region: Secondary | ICD-10-CM | POA: Diagnosis not present

## 2021-01-19 DIAGNOSIS — M9902 Segmental and somatic dysfunction of thoracic region: Secondary | ICD-10-CM | POA: Diagnosis not present

## 2021-01-19 DIAGNOSIS — S233XXA Sprain of ligaments of thoracic spine, initial encounter: Secondary | ICD-10-CM | POA: Diagnosis not present

## 2021-01-19 DIAGNOSIS — S338XXA Sprain of other parts of lumbar spine and pelvis, initial encounter: Secondary | ICD-10-CM | POA: Diagnosis not present

## 2021-01-23 ENCOUNTER — Ambulatory Visit (HOSPITAL_COMMUNITY): Payer: No Typology Code available for payment source

## 2021-01-30 ENCOUNTER — Other Ambulatory Visit (HOSPITAL_COMMUNITY): Payer: Self-pay | Admitting: Family Medicine

## 2021-01-30 DIAGNOSIS — R0600 Dyspnea, unspecified: Secondary | ICD-10-CM

## 2021-02-03 ENCOUNTER — Other Ambulatory Visit: Payer: Self-pay

## 2021-02-03 ENCOUNTER — Ambulatory Visit (HOSPITAL_COMMUNITY)
Admission: RE | Admit: 2021-02-03 | Discharge: 2021-02-03 | Disposition: A | Discharge status: Home / Self Care | Payer: PPO | Source: Ambulatory Visit | Attending: Internal Medicine | Admitting: Internal Medicine

## 2021-02-03 DIAGNOSIS — R6884 Jaw pain: Secondary | ICD-10-CM | POA: Diagnosis not present

## 2021-02-03 DIAGNOSIS — R058 Other specified cough: Secondary | ICD-10-CM | POA: Diagnosis not present

## 2021-02-03 DIAGNOSIS — M542 Cervicalgia: Secondary | ICD-10-CM | POA: Diagnosis not present

## 2021-02-03 DIAGNOSIS — R059 Cough, unspecified: Secondary | ICD-10-CM | POA: Diagnosis not present

## 2021-02-06 ENCOUNTER — Encounter: Payer: Self-pay | Admitting: *Deleted

## 2021-02-09 DIAGNOSIS — S338XXA Sprain of other parts of lumbar spine and pelvis, initial encounter: Secondary | ICD-10-CM | POA: Diagnosis not present

## 2021-02-09 DIAGNOSIS — M9902 Segmental and somatic dysfunction of thoracic region: Secondary | ICD-10-CM | POA: Diagnosis not present

## 2021-02-09 DIAGNOSIS — S233XXA Sprain of ligaments of thoracic spine, initial encounter: Secondary | ICD-10-CM | POA: Diagnosis not present

## 2021-02-09 DIAGNOSIS — M9901 Segmental and somatic dysfunction of cervical region: Secondary | ICD-10-CM | POA: Diagnosis not present

## 2021-02-14 ENCOUNTER — Ambulatory Visit: Payer: No Typology Code available for payment source | Admitting: Internal Medicine
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
## 2021-02-16 DIAGNOSIS — M9902 Segmental and somatic dysfunction of thoracic region: Secondary | ICD-10-CM | POA: Diagnosis not present

## 2021-02-16 DIAGNOSIS — S233XXA Sprain of ligaments of thoracic spine, initial encounter: Secondary | ICD-10-CM | POA: Diagnosis not present

## 2021-02-16 DIAGNOSIS — S338XXA Sprain of other parts of lumbar spine and pelvis, initial encounter: Secondary | ICD-10-CM | POA: Diagnosis not present

## 2021-02-16 DIAGNOSIS — M9901 Segmental and somatic dysfunction of cervical region: Secondary | ICD-10-CM | POA: Diagnosis not present

## 2021-02-22 ENCOUNTER — Other Ambulatory Visit (HOSPITAL_COMMUNITY): Payer: No Typology Code available for payment source

## 2021-03-01 ENCOUNTER — Ambulatory Visit (HOSPITAL_COMMUNITY)
Admission: RE | Admit: 2021-03-01 | Discharge: 2021-03-01 | Disposition: A | Discharge status: Home / Self Care | Payer: No Typology Code available for payment source | Source: Ambulatory Visit | Attending: Family Medicine | Admitting: Family Medicine

## 2021-03-01 ENCOUNTER — Other Ambulatory Visit: Payer: Self-pay

## 2021-03-01 DIAGNOSIS — R0600 Dyspnea, unspecified: Secondary | ICD-10-CM | POA: Diagnosis not present

## 2021-03-01 DIAGNOSIS — R0609 Other forms of dyspnea: Secondary | ICD-10-CM

## 2021-03-01 LAB — ECHOCARDIOGRAM COMPLETE
AR max vel: 2.57 cm2
AV Area VTI: 2.73 cm2
AV Area mean vel: 2.49 cm2
AV Mean grad: 4 mmHg
AV Peak grad: 6.9 mmHg
Ao pk vel: 1.31 m/s
Area-P 1/2: 4.65 cm2
MV VTI: 4.09 cm2
S' Lateral: 3.2 cm

## 2021-03-01 NOTE — Progress Notes (Signed)
*  PRELIMINARY RESULTS* Echocardiogram 2D Echocardiogram has been performed.  Alexander Gutierrez 03/01/2021, 1:58 PM

## 2021-03-02 ENCOUNTER — Other Ambulatory Visit: Payer: Self-pay

## 2021-03-02 ENCOUNTER — Encounter: Payer: Self-pay | Admitting: Pulmonary Disease

## 2021-03-02 ENCOUNTER — Ambulatory Visit: Payer: PPO | Admitting: Pulmonary Disease

## 2021-03-02 VITALS — BP 140/82 | HR 84 | Temp 98.4°F | Ht 69.5 in | Wt 313.0 lb

## 2021-03-02 DIAGNOSIS — R058 Other specified cough: Secondary | ICD-10-CM

## 2021-03-02 DIAGNOSIS — G4733 Obstructive sleep apnea (adult) (pediatric): Secondary | ICD-10-CM | POA: Diagnosis not present

## 2021-03-02 DIAGNOSIS — M9901 Segmental and somatic dysfunction of cervical region: Secondary | ICD-10-CM | POA: Diagnosis not present

## 2021-03-02 DIAGNOSIS — S233XXA Sprain of ligaments of thoracic spine, initial encounter: Secondary | ICD-10-CM | POA: Diagnosis not present

## 2021-03-02 DIAGNOSIS — G4730 Sleep apnea, unspecified: Secondary | ICD-10-CM

## 2021-03-02 DIAGNOSIS — E669 Obesity, unspecified: Secondary | ICD-10-CM | POA: Diagnosis not present

## 2021-03-02 DIAGNOSIS — S338XXA Sprain of other parts of lumbar spine and pelvis, initial encounter: Secondary | ICD-10-CM | POA: Diagnosis not present

## 2021-03-02 DIAGNOSIS — M9902 Segmental and somatic dysfunction of thoracic region: Secondary | ICD-10-CM | POA: Diagnosis not present

## 2021-03-02 NOTE — Progress Notes (Signed)
Alexander Gutierrez, Critical Care, and Sleep Medicine  Chief Complaint  Patient presents with   Consult    Sleep Consult from [name]. Has had a cpap for ten years uses consistently. Interested in learning about inspire. 2012 got a sleep study from [name] in [place].    Constitutional:  BP 140/82 (BP Location: Left Arm, Patient Position: Sitting)   Pulse 84   Temp 98.4 F (36.9 C) (Temporal)   Ht 5' 9.5" (1.765 m)   Wt (!) 313 lb (142 kg)   SpO2 99%   BMI 45.56 kg/m   Past Medical History:  Chronic back pain, Chronic fatigue, PTSD, GERD, Gout, HLD, ED, Depression, ADHD, Vocal cord dysfunction  Past Surgical History:  He  has a past surgical history that includes Laryngoscopy.  Brief Summary:  Alexander Gutierrez is a 60 y.o. male veteran with obstructive sleep apnea.        Subjective:   He seen by Dr. Melvyn Novas for cough.   He has history of sleep apnea.  Reports having sleep study about 10 yrs ago with the [place].  Has same CPAP since.  He has Respironics machine, but was told by [place] that he didn't need to worry about recall if he didn't use SoClean device.  He has been using CPAP more regularly over past couple of years.  He has claustrophobia, but has learned to tolerate wearing mask.  His wife hears him snore sometimes.  He was up 1 or 2 times to use the bathroom.  He has trouble falling asleep due to joint pains.  He has lump on his neck and had MRI at [name] recently.  He is not using anything to help sleep or stay awake.  He has to nap during the day.  His Epworth score is 23 out of 24.  Physical Exam:   Appearance - well kempt   ENMT - no sinus tenderness, no oral exudate, no LAN, Mallampati 4 airway, no stridor  Respiratory - equal breath sounds bilaterally, no wheezing or rales  CV - s1s2 regular rate and rhythm, no murmurs  Ext - no clubbing, no edema  Skin - no rashes  Psych - normal mood and affect   Gutierrez testing:  IgE 01/02/21 >> 40  Chest Imaging:  CT  angio chest 05/16/20 >> diffuse airway thickening, fatty liver  Sleep Tests:    Cardiac Tests:  Echo 03/01/21 >> EF 55 to 60%, mild LVH  Social History:  He  reports that he has never smoked. He has never used smokeless tobacco. He reports that he does not drink alcohol and does not use drugs.  Family History:  His family history is not on file.     Assessment/Plan:   Obstructive sleep apnea. - he will bring his SD card - will arrange for home sleep study to assess current status - will then likely arrange for new DME outside the [place] and get new auto CPAP machine - discussed alternative therapies for sleep apnea - he will be enrolling in a research protocol in [name] and is hoping he will lose weight through this  Upper airway cough with vocal cord dysfunction. - monitor clinically  Time Spent Involved in Patient Care on Day of Examination:  32 minutes  Follow up:   Patient Instructions  Will arrange for home sleep study and call with results  Follow up in 5 months  Medication List:   Allergies as of 03/02/2021   No Known Allergies  Medication List        Accurate as of March 02, 2021 10:20 AM. If you have any questions, ask your nurse or doctor.          allopurinol 100 MG tablet Commonly known as: ZYLOPRIM Take 100 mg by mouth daily.   famotidine 20 MG tablet Commonly known as: Pepcid One after supper   pantoprazole 40 MG tablet Commonly known as: Protonix Take 1 tablet (40 mg total) by mouth daily. Take 30-60 min before first meal of the day   predniSONE 10 MG tablet Commonly known as: DELTASONE Take  4 each am x 2 days,   2 each am x 2 days,  1 each am x 2 days and stop   propranolol 10 MG tablet Commonly known as: INDERAL Take 10 mg by mouth daily.   tadalafil 5 MG tablet Commonly known as: CIALIS Take 5 mg by mouth daily as needed for erectile dysfunction.   traMADol 50 MG tablet Commonly known as: ULTRAM Take 1 tablet (50 mg  total) by mouth every 4 (four) hours as needed.   ZOLOFT PO Take 1 tablet by mouth daily.        Signature:  Chesley Mires, MD [place] Pager - [phone] 03/02/2021, 10:20 AM

## 2021-03-02 NOTE — Patient Instructions (Signed)
Will arrange for home sleep study and call with results  Follow up in 5 months

## 2021-04-12 DIAGNOSIS — S233XXA Sprain of ligaments of thoracic spine, initial encounter: Secondary | ICD-10-CM | POA: Diagnosis not present

## 2021-04-12 DIAGNOSIS — M9902 Segmental and somatic dysfunction of thoracic region: Secondary | ICD-10-CM | POA: Diagnosis not present

## 2021-04-12 DIAGNOSIS — M9901 Segmental and somatic dysfunction of cervical region: Secondary | ICD-10-CM | POA: Diagnosis not present

## 2021-04-12 DIAGNOSIS — S338XXA Sprain of other parts of lumbar spine and pelvis, initial encounter: Secondary | ICD-10-CM | POA: Diagnosis not present

## 2021-04-17 ENCOUNTER — Telehealth: Payer: Self-pay | Admitting: Pulmonary Disease

## 2021-04-17 NOTE — Telephone Encounter (Signed)
Card was brought in by patient. Reading no data on card when attempted to get a DL from card.   Will try to contact [facility] per pt request to get a DL for Dr. Halford Chessman

## 2021-04-21 ENCOUNTER — Ambulatory Visit: Payer: No Typology Code available for payment source | Admitting: Internal Medicine

## 2021-04-21 DIAGNOSIS — Z125 Encounter for screening for malignant neoplasm of prostate: Secondary | ICD-10-CM | POA: Diagnosis not present

## 2021-04-21 DIAGNOSIS — Z79899 Other long term (current) drug therapy: Secondary | ICD-10-CM | POA: Diagnosis not present

## 2021-04-21 DIAGNOSIS — Z131 Encounter for screening for diabetes mellitus: Secondary | ICD-10-CM | POA: Diagnosis not present

## 2021-04-21 DIAGNOSIS — Z13220 Encounter for screening for lipoid disorders: Secondary | ICD-10-CM | POA: Diagnosis not present

## 2021-04-21 DIAGNOSIS — Z1329 Encounter for screening for other suspected endocrine disorder: Secondary | ICD-10-CM | POA: Diagnosis not present

## 2021-04-26 DIAGNOSIS — E785 Hyperlipidemia, unspecified: Secondary | ICD-10-CM | POA: Diagnosis not present

## 2021-04-26 DIAGNOSIS — R600 Localized edema: Secondary | ICD-10-CM | POA: Diagnosis not present

## 2021-04-26 DIAGNOSIS — M542 Cervicalgia: Secondary | ICD-10-CM | POA: Diagnosis not present

## 2021-05-04 NOTE — Telephone Encounter (Signed)
[place] [phone]. Call was not answered. Will ATC again.

## 2021-05-15 DIAGNOSIS — S233XXA Sprain of ligaments of thoracic spine, initial encounter: Secondary | ICD-10-CM | POA: Diagnosis not present

## 2021-05-15 DIAGNOSIS — S338XXA Sprain of other parts of lumbar spine and pelvis, initial encounter: Secondary | ICD-10-CM | POA: Diagnosis not present

## 2021-05-15 DIAGNOSIS — M9902 Segmental and somatic dysfunction of thoracic region: Secondary | ICD-10-CM | POA: Diagnosis not present

## 2021-05-15 DIAGNOSIS — M9901 Segmental and somatic dysfunction of cervical region: Secondary | ICD-10-CM | POA: Diagnosis not present

## 2021-05-24 DIAGNOSIS — F4310 Post-traumatic stress disorder, unspecified: Secondary | ICD-10-CM | POA: Diagnosis not present

## 2021-05-24 DIAGNOSIS — R0602 Shortness of breath: Secondary | ICD-10-CM | POA: Diagnosis not present

## 2021-05-24 DIAGNOSIS — M545 Low back pain, unspecified: Secondary | ICD-10-CM | POA: Diagnosis not present

## 2021-05-24 DIAGNOSIS — N400 Enlarged prostate without lower urinary tract symptoms: Secondary | ICD-10-CM | POA: Diagnosis not present

## 2021-05-24 DIAGNOSIS — D171 Benign lipomatous neoplasm of skin and subcutaneous tissue of trunk: Secondary | ICD-10-CM | POA: Diagnosis not present

## 2021-05-24 DIAGNOSIS — R600 Localized edema: Secondary | ICD-10-CM | POA: Diagnosis not present

## 2021-05-24 DIAGNOSIS — G4730 Sleep apnea, unspecified: Secondary | ICD-10-CM | POA: Diagnosis not present

## 2021-05-24 DIAGNOSIS — D234 Other benign neoplasm of skin of scalp and neck: Secondary | ICD-10-CM | POA: Diagnosis not present

## 2021-05-24 DIAGNOSIS — R5383 Other fatigue: Secondary | ICD-10-CM | POA: Diagnosis not present

## 2021-05-29 NOTE — Telephone Encounter (Signed)
[place] on behalf of patient to verify CPAP and get compliance report. Operator transferred call to the Prosthetics department. Line rang for approx 3 minutes. No Vm was available.

## 2021-05-30 ENCOUNTER — Ambulatory Visit: Payer: No Typology Code available for payment source | Admitting: Internal Medicine
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
## 2021-06-01 ENCOUNTER — Other Ambulatory Visit: Payer: Self-pay | Admitting: Internal Medicine

## 2021-06-01 ENCOUNTER — Other Ambulatory Visit (HOSPITAL_COMMUNITY): Payer: Self-pay | Admitting: Internal Medicine

## 2021-06-01 DIAGNOSIS — M545 Low back pain, unspecified: Secondary | ICD-10-CM

## 2021-06-01 DIAGNOSIS — R600 Localized edema: Secondary | ICD-10-CM

## 2021-06-02 ENCOUNTER — Telehealth: Payer: Self-pay | Admitting: Pulmonary Disease

## 2021-06-02 NOTE — Telephone Encounter (Signed)
Pt is not using [facility] for insurance.  Using [employer].

## 2021-06-02 NOTE — Telephone Encounter (Signed)
Pt states he got a replacement CPAP from [facility] but was asking about the CPAP that was going to be ordered through the [place] when he seen Dr. Halford Chessman.  Wants to know if he just needs to keep the replacement machine or send order to [place].     Dr. Halford Chessman please advise

## 2021-06-05 DIAGNOSIS — S338XXA Sprain of other parts of lumbar spine and pelvis, initial encounter: Secondary | ICD-10-CM | POA: Diagnosis not present

## 2021-06-05 DIAGNOSIS — M9902 Segmental and somatic dysfunction of thoracic region: Secondary | ICD-10-CM | POA: Diagnosis not present

## 2021-06-05 DIAGNOSIS — M9901 Segmental and somatic dysfunction of cervical region: Secondary | ICD-10-CM | POA: Diagnosis not present

## 2021-06-05 DIAGNOSIS — S233XXA Sprain of ligaments of thoracic spine, initial encounter: Secondary | ICD-10-CM | POA: Diagnosis not present

## 2021-06-05 NOTE — Telephone Encounter (Signed)
Since he got a replacement [employer], he doesn't need another machine from the [place].

## 2021-06-05 NOTE — Telephone Encounter (Signed)
ATC patient. Left detailed message (ok per DPR). Nothing further needed.

## 2021-06-06 ENCOUNTER — Other Ambulatory Visit: Payer: Self-pay | Admitting: *Deleted

## 2021-06-06 DIAGNOSIS — D234 Other benign neoplasm of skin of scalp and neck: Secondary | ICD-10-CM

## 2021-06-13 ENCOUNTER — Encounter: Payer: Self-pay | Admitting: General Surgery

## 2021-06-13 ENCOUNTER — Encounter: Payer: Self-pay | Admitting: *Deleted

## 2021-06-13 ENCOUNTER — Ambulatory Visit: Payer: PPO | Admitting: General Surgery

## 2021-06-13 ENCOUNTER — Other Ambulatory Visit: Payer: Self-pay

## 2021-06-13 VITALS — BP 117/79 | HR 79 | Temp 97.8°F | Resp 14 | Ht 69.5 in | Wt 311.0 lb

## 2021-06-13 DIAGNOSIS — D234 Other benign neoplasm of skin of scalp and neck: Secondary | ICD-10-CM | POA: Diagnosis not present

## 2021-06-13 MED ORDER — OXYCODONE HCL 5 MG PO TABS
5.0000 mg | ORAL_TABLET | ORAL | 0 refills | Status: DC | PRN
Start: 2021-06-13 — End: 2021-06-20

## 2021-06-13 NOTE — Progress Notes (Incomplete)
[facility] History and Physical  Reason for Referral:*** Referring Physician: ***  Chief Complaint   New Patient (Initial Visit)     Alexander Gutierrez is a 60 y.o. male.  HPI: ***.  The *** started *** and has had a duration of ***.  It is associated with ***.  The *** is improved with ***, and is made worse with ***.    Quality*** Context***  Past Medical History:  Diagnosis Date   ADHD    Chronic back pain    Chronic fatigue    Depression    Erectile dysfunction    GERD (gastroesophageal reflux disease)    Gout    Hypercholesteremia    OSA (obstructive sleep apnea)    PTSD (post-traumatic stress disorder)    Vocal cord dysfunction     Past Surgical History:  Procedure Laterality Date   LARYNGOSCOPY      History reviewed. No pertinent family history.  Social History   Tobacco Use   Smoking status: Never   Smokeless tobacco: Never  Substance Use Topics   Alcohol use: No   Drug use: No    Medications: {medication reviewed/display:3041432} Allergies as of 06/13/2021   No Known Allergies      Medication List        Accurate as of June 13, 2021 10:37 AM. If you have any questions, ask your nurse or doctor.          STOP taking these medications    famotidine 20 MG tablet Commonly known as: Pepcid Stopped by: Virl Cagey, MD   pantoprazole 40 MG tablet Commonly known as: Protonix Stopped by: Virl Cagey, MD   predniSONE 10 MG tablet Commonly known as: DELTASONE Stopped by: Virl Cagey, MD   traMADol 50 MG tablet Commonly known as: ULTRAM Stopped by: Virl Cagey, MD       TAKE these medications    allopurinol 100 MG tablet Commonly known as: ZYLOPRIM Take 100 mg by mouth daily.   atorvastatin 20 MG tablet Commonly known as: LIPITOR Take 20 mg by mouth daily.   furosemide 20 MG tablet Commonly known as: LASIX Take 20 mg by mouth 2 (two) times daily.   omeprazole 20 MG  capsule Commonly known as: PRILOSEC TAKE ONE CAPSULE BY MOUTH ONCE DAILY BEFORE A MEAL TO CONTROL STOMACH ACID.  TAKE 30 MINUTES BEFORE A MEAL   propranolol 10 MG tablet Commonly known as: INDERAL Take 10 mg by mouth daily.   tadalafil 5 MG tablet Commonly known as: CIALIS Take 5 mg by mouth daily as needed for erectile dysfunction.   tamsulosin 0.4 MG Caps capsule Commonly known as: FLOMAX Take 0.4 mg by mouth daily.   ZOLOFT PO Take 1 tablet by mouth daily.         ROS:  {Review of Systems:30496}  Blood pressure 117/79, pulse 79, temperature 97.8 F (36.6 C), temperature source Oral, resp. rate 14, height 5' 9.5" (1.765 m), weight (!) 311 lb (141.1 kg), SpO2 95 %. Physical Exam  Results: No results found for this or any previous visit (from the past 48 hour(s)).  No results found.   Assessment & Plan:  Alexander Gutierrez is a 60 y.o. male with *** -*** -*** -Follow up ***  All questions were answered to the satisfaction of the patient and family***.  The risk and benefits of *** were discussed including but not limited to ***.  After careful consideration, Alexander Gutierrez has decided to ***.  Virl Cagey 06/13/2021, 10:37 AM

## 2021-06-13 NOTE — Progress Notes (Signed)
[facility] History and Physical  Reason for Referral: Cyst on neck  Referring Physician: Jani Gravel, MD   Chief Complaint   New Patient (Initial Visit)     Alexander Gutierrez is a 60 y.o. male.  HPI: Alexander Gutierrez is a 60 yo who has had a cyst on his left posterior neck for about 4 years. He has been seen at the [place] and has known about this area but has not had it removed. He says that it bothers him at times and gets swollen and red. He has never had anything drain and has never had any antibiotics for the area. Currently it feels more swollen than it has been and is causing him a dull ache in the left neck and pressure in his head on that side he thinks.   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 He says he is getting an MRI of the lower back to look at this further given his history of pain in that area.   Past Medical History:  Diagnosis Date   ADHD    Chronic back pain    Chronic fatigue    Depression    Erectile dysfunction    GERD (gastroesophageal reflux disease)    Gout    Hypercholesteremia    OSA (obstructive sleep apnea)    PTSD (post-traumatic stress disorder)    Vocal cord dysfunction     Past Surgical History:  Procedure Laterality Date   LARYNGOSCOPY      History reviewed. No pertinent family history.  Social History   Tobacco Use   Smoking status: Never   Smokeless tobacco: Never  Substance Use Topics   Alcohol use: No   Drug use: No    Medications: I have reviewed the patient's current medications. Allergies as of 06/13/2021   No Known Allergies      Medication List        Accurate as of June 13, 2021 11:39 AM. If you have any questions, ask your nurse or doctor.          STOP taking these medications    famotidine 20 MG tablet Commonly known as: Pepcid Stopped by: Virl Cagey, MD   pantoprazole 40 MG tablet Commonly known as: Protonix Stopped by:  Virl Cagey, MD   predniSONE 10 MG tablet Commonly known as: DELTASONE Stopped by: Virl Cagey, MD   traMADol 50 MG tablet Commonly known as: ULTRAM Stopped by: Virl Cagey, MD       TAKE these medications    allopurinol 100 MG tablet Commonly known as: ZYLOPRIM Take 100 mg by mouth daily.   atorvastatin 20 MG tablet Commonly known as: LIPITOR Take 20 mg by mouth daily.   furosemide 20 MG tablet Commonly known as: LASIX Take 20 mg by mouth 2 (two) times daily.   omeprazole 20 MG capsule Commonly known as: PRILOSEC TAKE ONE CAPSULE BY MOUTH ONCE DAILY BEFORE A MEAL TO CONTROL STOMACH ACID.  TAKE 30 MINUTES BEFORE A MEAL   oxyCODONE 5 MG immediate release tablet Commonly known as: Roxicodone Take 1 tablet (5 mg total) by mouth every 4 (four) hours as needed for severe pain or breakthrough pain. Started by: Virl Cagey, MD   propranolol 10 MG tablet Commonly known as: INDERAL Take 10 mg by mouth daily.   tadalafil 5 MG tablet Commonly known as: CIALIS Take 5 mg  by mouth daily as needed for erectile dysfunction.   tamsulosin 0.4 MG Caps capsule Commonly known as: FLOMAX Take 0.4 mg by mouth daily.   ZOLOFT PO Take 1 tablet by mouth daily.         ROS:  A comprehensive review of systems was negative except for: Ears, nose, mouth, throat, and face: positive for sinus issues Respiratory: positive for wheezing and SOB Musculoskeletal: positive for back pain and neck pain Neurological: positive for headaches  Blood pressure 117/79, pulse 79, temperature 97.8 F (36.6 C), temperature source Oral, resp. rate 14, height 5' 9.5" (1.765 m), weight (!) 311 lb (141.1 kg), SpO2 95 %. Physical Exam Vitals reviewed.  Constitutional:      Appearance: Normal appearance.  HENT:     Head: Normocephalic.     Nose: Nose normal.  Eyes:     Extraocular Movements: Extraocular movements intact.  Neck:     Comments: Left posterior neck just below  hair line is a 2cm mobile superficial mass, tender  Cardiovascular:     Rate and Rhythm: Normal rate and regular rhythm.  Pulmonary:     Effort: Pulmonary effort is normal.     Breath sounds: Normal breath sounds.  Abdominal:     General: There is no distension.     Palpations: Abdomen is soft.     Tenderness: There is no abdominal tenderness.  Musculoskeletal:        General: Normal range of motion.     Cervical back: Normal range of motion.  Skin:    General: Skin is warm.  Neurological:     General: No focal deficit present.     Mental Status: He is alert and oriented to person, place, and time.  Psychiatric:        Mood and Affect: Mood normal.    Procedure: Excision of left neck mass 2cm Diagnosis: Left neck mass  Description: The hairline was shaved and cleaned off. The left neck was prepped and draped with betadine. Lidocaine was injected over the area. An incision was made over the mass, and carried down to the subcutaneous tissue. With blunt and sharp dissection a hard mobile mass was excised. This was removed in its entirety and measured 2cm. The mass was opened to see if it was a cyst given the extent of fatty tissue surrounding it. No obvious cyst was noted but there was some fibrosis, possibly from an involuted cyst. The specimen was sent to pathology. The cavity was made hemostatic and closed with 3-0 Vicryl interrupted and 3-0 Nylon. A gauze and tegaderm was placed.   Assessment & Plan:  Alexander Gutierrez is a 60 y.o. male with a left neck cyst. This was excised in the clinic.  The patient was given roxicodone for pain control in addition to tylenol and ibuprofen.   All questions were answered to the satisfaction of the patient. Will remove the sutures in a few weeks.   Future Appointments  Date Time Provider [place]  06/14/2021 10:30 AM AP-US 2 AP-US Deneise Lever [facility]  06/28/2021  1:45 PM Virl Cagey, MD RS-RS None  07/31/2021 11:00 AM Tanda Rockers, MD  [facility] None     Virl Cagey 06/13/2021, 11:39 AM

## 2021-06-13 NOTE — Patient Instructions (Signed)
Discharge Instructions:  Common Complaints: Pain at the incision site is common.  Bruising is common.  Leave the bandage on for 48 hours unless it gets saturated.   Diet/ Activity: Limit stretching, pulling on your incision if it is located on other parts of your body.  Replace the bandage / bandaid and do neosporin on the incision daily after 48 hours.    Medication: Take tylenol and ibuprofen as needed for pain control, alternating every 4-6 hours.  Example:  Tylenol 1000mg  @ 6am, 12noon, 6pm, 27midnight (Do not exceed 4000mg  of tylenol a day). Ibuprofen 800mg  @ 9am, 3pm, 9pm, 3am (Do not exceed 3600mg  of ibuprofen a day).  Take Roxicodone for breakthrough pain every 4 hours.  Take Colace for constipation related to narcotic pain medication. If you do not have a bowel movement in 2 days, take Miralax over the counter.  Drink plenty of water to also prevent constipation.   Contact Information: If you have questions or concerns, please call our office, [phone], Monday- Thursday 8AM-5PM and Friday 8AM-12Noon.  If it is after hours or on the weekend, please call [facility], [phone], [phone] and ask to speak to the surgeon on call for Dr. Constance Haw at [facility].

## 2021-06-14 ENCOUNTER — Ambulatory Visit (HOSPITAL_COMMUNITY)
Admission: RE | Admit: 2021-06-14 | Discharge: 2021-06-14 | Disposition: A | Discharge status: Home / Self Care | Payer: PPO | Source: Ambulatory Visit | Attending: Internal Medicine | Admitting: Internal Medicine

## 2021-06-14 DIAGNOSIS — R600 Localized edema: Secondary | ICD-10-CM | POA: Insufficient documentation

## 2021-06-14 DIAGNOSIS — M545 Low back pain, unspecified: Secondary | ICD-10-CM

## 2021-06-14 NOTE — Telephone Encounter (Signed)
Called [place] at the sleep apnea clinic. RT states if she does get a compliance report pulled up on patient she will have to mail it to patient as she cannot fax it to [place] for risk of HIPAA violation.   ATC patient to make him aware. No VM available.

## 2021-06-15 LAB — TISSUE SPECIMEN

## 2021-06-15 LAB — PATHOLOGY REPORT

## 2021-06-16 ENCOUNTER — Other Ambulatory Visit: Payer: Self-pay

## 2021-06-16 ENCOUNTER — Ambulatory Visit: Payer: PPO

## 2021-06-16 DIAGNOSIS — G4733 Obstructive sleep apnea (adult) (pediatric): Secondary | ICD-10-CM

## 2021-06-20 ENCOUNTER — Encounter: Payer: Self-pay | Admitting: General Surgery

## 2021-06-20 ENCOUNTER — Other Ambulatory Visit: Payer: Self-pay

## 2021-06-20 ENCOUNTER — Ambulatory Visit (INDEPENDENT_AMBULATORY_CARE_PROVIDER_SITE_OTHER): Payer: PPO | Admitting: General Surgery

## 2021-06-20 VITALS — BP 115/70 | HR 87 | Temp 97.3°F | Resp 16 | Ht 69.5 in | Wt 308.0 lb

## 2021-06-20 DIAGNOSIS — D170 Benign lipomatous neoplasm of skin and subcutaneous tissue of head, face and neck: Secondary | ICD-10-CM

## 2021-06-20 MED ORDER — OXYCODONE HCL 5 MG PO TABS: 5.0000 mg | ORAL_TABLET | ORAL | 0 refills | Status: DC | PRN

## 2021-06-20 NOTE — Patient Instructions (Signed)
Tylenol 1000mg  @ 6am, 12noon, 6pm, 40midnight (Do not exceed 4000mg  of tylenol a day).  Ibuprofen 800mg  @ 9am, 3pm, 9pm, 3am (Do not exceed 3600mg  of ibuprofen a day).  Take Roxicodone 5-10 mg for breakthrough pain every 4 hours.

## 2021-06-21 NOTE — Progress Notes (Signed)
[facility]  Left posterior neck incision inspected. No signs of erythema or drainage. Slightly swollen.   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   Left posterior neck excision of lipoma.  Neosporin and bandaid replaced.  Monitor for signs of infection.   Future Appointments  Date Time Provider [place]  06/28/2021  1:45 PM Virl Cagey, MD RS-RS None  07/31/2021 11:00 AM Tanda Rockers, MD [facility] None   Curlene Labrum, MD [facility] [address] [place], [place] [address] [phone] (office)

## 2021-06-22 ENCOUNTER — Telehealth: Payer: Self-pay | Admitting: Pulmonary Disease

## 2021-06-22 DIAGNOSIS — G4733 Obstructive sleep apnea (adult) (pediatric): Secondary | ICD-10-CM | POA: Diagnosis not present

## 2021-06-22 NOTE — Telephone Encounter (Signed)
HST 06/17/21 >> AHI 59, SpO2 low 76%   Please let him know that his sleep study shows severe sleep apnea.  If he is doing okay since he got his replacement [employer] CPAP machine, then he can follow up in few 3 months.

## 2021-06-22 NOTE — Telephone Encounter (Signed)
Called and spoke to patient. Gave results. Pt states AHI on last Sleep study was half of the number this time. States he has no questions about that. Is feeling like the replacement  machine is working well. Scheduled him for the last week in March in [facility] office and will mail appt reminders to patient. Nothing further needed.

## 2021-06-22 NOTE — Telephone Encounter (Signed)
Was addressed with patient during results phone call. Nothing further needed.

## 2021-06-27 ENCOUNTER — Telehealth: Payer: Self-pay | Admitting: Internal Medicine

## 2021-06-27 ENCOUNTER — Other Ambulatory Visit: Payer: Self-pay

## 2021-06-27 DIAGNOSIS — R058 Other specified cough: Secondary | ICD-10-CM

## 2021-06-27 MED ORDER — ALBUTEROL SULFATE (2.5 MG/3ML) 0.083% IN NEBU: 2.5000 mg | INHALATION_SOLUTION | Freq: Four times a day (QID) | RESPIRATORY_TRACT | 2 refills | Status: DC | PRN

## 2021-06-27 NOTE — Telephone Encounter (Signed)
ATC patient again. Need clarification on PCP at [facility] if patient wants nebulizer machine through [place] and if patient does not want nebulizer machine through [place] need clarification so [facility] knows where to send order. LVM letting patient know we had some questions for him and to call back with [facility] office number

## 2021-06-27 NOTE — Telephone Encounter (Signed)
Okay to send order for nebulizer machine.  Can send order for albuterol 2.5 mg/3 ml q6h prn.  Provide 30 day supply with 2 refills.

## 2021-06-27 NOTE — Telephone Encounter (Signed)
ATC patient. Lvm letting patient know we would send nebulizer to the [facility] and neb solution to [facility] [address]. Advised patient to call back for questions or changes.

## 2021-06-27 NOTE — Telephone Encounter (Signed)
Did not see any documentation regarding ordering a nebulizer machine.  Dr. Halford Chessman please advise if okay to order for patient?

## 2021-06-28 ENCOUNTER — Encounter: Payer: Self-pay | Admitting: General Surgery

## 2021-06-28 ENCOUNTER — Other Ambulatory Visit: Payer: Self-pay

## 2021-06-28 ENCOUNTER — Ambulatory Visit (INDEPENDENT_AMBULATORY_CARE_PROVIDER_SITE_OTHER): Payer: PPO | Admitting: General Surgery

## 2021-06-28 VITALS — BP 124/72 | HR 88 | Temp 97.6°F | Resp 18 | Ht 69.5 in | Wt 311.0 lb

## 2021-06-28 DIAGNOSIS — D170 Benign lipomatous neoplasm of skin and subcutaneous tissue of head, face and neck: Secondary | ICD-10-CM

## 2021-06-28 NOTE — Progress Notes (Signed)
[facility]  Having left sided sinus issues and pressure still. MRI with epidural lipomatous and narrowing.  BP 124/72    Pulse 88    Temp 97.6 F (36.4 C) (Other (Comment))    Resp 18    Ht 5' 9.5" (1.765 m)    Wt (!) 311 lb (141.1 kg)    SpO2 94%    BMI 45.27 kg/m  Sutures removed. No erythema or drainage. Steri strips in place.  Patient s/p lipoma excision doing well. Steristrips will peel off in the next 5-7 days. You can remove them once they are peeling off. It is ok to shower. Pat the area dry.  Get PCP to refer to ENT regarding sinuses, as I do not think the lipoma was causing those issues Spine for back issues/ spine issues   PRN Curlene Labrum, MD [facility] [address] [place], [place] [address] [phone] (office)

## 2021-06-28 NOTE — Patient Instructions (Signed)
Steristrips will peel off in the next 5-7 days. You can remove them once they are peeling off. It is ok to shower. Pat the area dry.

## 2021-06-30 NOTE — Telephone Encounter (Signed)
Patient returned call and states he would like to use a different dme than the [place]. Judeen Hammans [facility] notified

## 2021-07-13 ENCOUNTER — Other Ambulatory Visit: Payer: Self-pay

## 2021-07-13 ENCOUNTER — Encounter (HOSPITAL_COMMUNITY): Payer: Self-pay

## 2021-07-13 ENCOUNTER — Emergency Department (HOSPITAL_COMMUNITY)
Admission: EM | Admit: 2021-07-13 | Discharge: 2021-07-13 | Disposition: A | Discharge status: Home / Self Care | Payer: No Typology Code available for payment source | Attending: Emergency Medicine | Admitting: Emergency Medicine

## 2021-07-13 DIAGNOSIS — M5442 Lumbago with sciatica, left side: Secondary | ICD-10-CM | POA: Diagnosis present

## 2021-07-13 DIAGNOSIS — M5432 Sciatica, left side: Secondary | ICD-10-CM

## 2021-07-13 MED ORDER — KETOROLAC TROMETHAMINE 30 MG/ML IJ SOLN
30.0000 mg | Freq: Once | INTRAMUSCULAR | Status: AC
Start: 2021-07-13 — End: 2021-07-13
  Administered 2021-07-13: 30 mg via INTRAMUSCULAR
  Filled 2021-07-13: qty 1

## 2021-07-13 MED ORDER — HYDROMORPHONE HCL 1 MG/ML IJ SOLN
INTRAMUSCULAR | Status: AC
  Filled 2021-07-13: qty 1

## 2021-07-13 MED ORDER — METHYLPREDNISOLONE 4 MG PO TBPK: ORAL_TABLET | ORAL | 0 refills | Status: DC

## 2021-07-13 MED ORDER — HYDROMORPHONE HCL 1 MG/ML IJ SOLN
1.0000 mg | Freq: Once | INTRAMUSCULAR | Status: AC
  Administered 2021-07-13: 1 mg via INTRAMUSCULAR
  Filled 2021-07-13: qty 1

## 2021-07-13 NOTE — Discharge Instructions (Signed)
You were seen today for back and buttock pain.  Your history and physical exam is suggestive of sciatica.  Take the Medrol Dosepak.  Make sure that you are stretching.  You may use your home oxycodone for breakthrough pain.

## 2021-07-13 NOTE — ED Provider Notes (Signed)
[facility] Provider Note   CSN: [id number] Arrival date & time: 07/13/21  2979     History  Chief Complaint  Patient presents with   Sciatica    [facility] is a 61 y.o. male.  HPI  This is a 61 year old male with a history of gout, back pain, obstructive sleep apnea who presents with back and leg pain.  Patient reports 2-day history of worsening back and leg pain.  He states he has pain that originates in his back and left buttock and radiates down his leg.  He states at times his toes are numb and burning.  He has not noted any bowel or bladder difficulty.  No weakness of the lower extremities.  He does have a history of chronic back discomfort but never anything this bad.  He denies any new injury.  He does not normally have a wallet in his left pocket.  Rates his pain a 10 out of 10.  Took oxycodone with minimal relief.  Took 2 ibuprofen with minimal relief.  Home Medications Prior to Admission medications   Medication Sig Start Date End Date Taking? Authorizing Provider  albuterol (PROVENTIL) (2.5 MG/3ML) 0.083% nebulizer solution Take 3 mLs (2.5 mg total) by nebulization every 6 (six) hours as needed for wheezing or shortness of breath. 06/27/21 07/27/21  Chesley Mires, MD  allopurinol (ZYLOPRIM) 100 MG tablet Take 100 mg by mouth daily.    [provider]  atorvastatin (LIPITOR) 20 MG tablet Take 20 mg by mouth daily.    [provider]  furosemide (LASIX) 20 MG tablet Take 20 mg by mouth 2 (two) times daily. 02/28/21   [provider]  omeprazole (PRILOSEC) 20 MG capsule TAKE ONE CAPSULE BY MOUTH ONCE DAILY BEFORE A MEAL TO CONTROL STOMACH ACID.  TAKE 30 MINUTES BEFORE A MEAL 02/28/21   [provider]  oxyCODONE (ROXICODONE) 5 MG immediate release tablet Take 1 tablet (5 mg total) by mouth every 4 (four) hours as needed for severe pain or breakthrough pain. 06/20/21   Virl Cagey, MD  propranolol (INDERAL) 10 MG tablet  Take 10 mg by mouth daily.    [provider]  Sertraline HCl (ZOLOFT PO) Take 1 tablet by mouth daily.    [provider]  tadalafil (CIALIS) 5 MG tablet Take 5 mg by mouth daily as needed for erectile dysfunction.    [provider]  tamsulosin (FLOMAX) 0.4 MG CAPS capsule Take 0.4 mg by mouth daily. 05/25/21   [provider]      Allergies    Patient has no known allergies.    Review of Systems   Review of Systems  Constitutional:  Negative for fever.  Respiratory:  Negative for shortness of breath.   Cardiovascular:  Negative for chest pain.  Musculoskeletal:  Positive for back pain.  All other systems reviewed and are negative.  Physical Exam Updated Vital Signs BP (!) 134/92    Pulse 85    Temp 97.7 F (36.5 C)    Resp 20    Ht 1.778 m (5\' 10" )    Wt (!) 141 kg    SpO2 96%    BMI 44.60 kg/m  Physical Exam Vitals and nursing note reviewed.  Constitutional:      Appearance: He is well-developed. He is obese.  HENT:     Head: Normocephalic and atraumatic.     Mouth/Throat:     Mouth: Mucous membranes are moist.  Eyes:  Pupils: Pupils are equal, round, and reactive to light.  Cardiovascular:     Rate and Rhythm: Normal rate and regular rhythm.  Pulmonary:     Effort: Pulmonary effort is normal. No respiratory distress.  Abdominal:     Palpations: Abdomen is soft.     Tenderness: There is no abdominal tenderness.  Musculoskeletal:     Cervical back: Neck supple.     Comments: Tenderness palpation along the left piriformis region, positive left straight leg raise  Lymphadenopathy:     Cervical: No cervical adenopathy.  Skin:    General: Skin is warm and dry.  Neurological:     Mental Status: He is alert and oriented to person, place, and time.     Comments: 5 out of 5 strength bilateral lower extremities, 2+ patellar reflexes symmetric bilateral  Psychiatric:        Mood and Affect: Mood normal.    ED Results / Procedures /  Treatments   Labs (all labs ordered are listed, but only abnormal results are displayed) Labs Reviewed - No data to display  EKG None  Radiology No results found.  Procedures Procedures    Medications Ordered in ED Medications  ketorolac (TORADOL) 30 MG/ML injection 30 mg (has no administration in time range)  HYDROmorphone (DILAUDID) injection 1 mg (has no administration in time range)    ED Course/ Medical Decision Making/ A&P                           Medical Decision Making Risk Prescription drug management.   This patient presents to the ED for concern of back pain and leg pain, this involves an extensive number of treatment options, and is a complaint that carries with it a high risk of complications and morbidity.  The differential diagnosis includes sciatica, musculoskeletal pain, piriformis syndrome, herniated  MDM:    This is a 61 year old male who presents with back pain radiates into the left leg and buttock.  He is nontoxic and vital signs are reassuring.  He has a positive left straight leg raise.  Neurologic exam is reassuring.  Given description of pain, high suspicion for sciatica or piriformis syndrome given tenderness along the piriformis region.  Denies any new injury.  X-rays considered but do not feel that these will provide any clinically significant information.  We will focus on treating supportively.  Patient given IM Toradol and Dilaudid.  Prior creatinines in the 1.2-1.3 range.  Would likely benefit from a Medrol Dosepak. (Labs, imaging)  Labs: I Ordered, and personally interpreted labs.  The pertinent results include: None  Imaging Studies ordered: I ordered imaging studies including none I independently visualized and interpreted imaging. I agree with the radiologist interpretation  Additional history obtained from chart review.  External records from outside source obtained and reviewed including prior visits  Critical Interventions: IM  Toradol and Dilaudid  Consultations: I requested consultation with the none,  and discussed lab and imaging findings as well as pertinent plan - they recommend: None  Cardiac Monitoring: The patient was maintained on a cardiac monitor.  I personally viewed and interpreted the cardiac monitored which showed an underlying rhythm of: Normal sinus rhythm  Reevaluation: After the interventions noted above, I reevaluated the patient and found that they have :improved   Considered admission for: N/A  Social Determinants of Health: Lives independently  Disposition: Discharge  Co morbidities that complicate the patient evaluation  Past Medical History:  Diagnosis  Date   ADHD    Chronic back pain    Chronic fatigue    Depression    Erectile dysfunction    GERD (gastroesophageal reflux disease)    Gout    Hypercholesteremia    OSA (obstructive sleep apnea)    PTSD (post-traumatic stress disorder)    Vocal cord dysfunction      Medicines Meds ordered this encounter  Medications   ketorolac (TORADOL) 30 MG/ML injection 30 mg   HYDROmorphone (DILAUDID) injection 1 mg    I have reviewed the patients home medicines and have made adjustments as needed  Problem List / ED Course: Problem List Items Addressed This Visit   None Visit Diagnoses     Sciatica of left side    -  Primary                   Final Clinical Impression(s) / ED Diagnoses Final diagnoses:  Sciatica of left side    Rx / DC Orders ED Discharge Orders     None         Merryl Hacker, MD 07/13/21 [phone]

## 2021-07-13 NOTE — ED Triage Notes (Signed)
Pov from home with cc of left leg sciatic pain that has been going on for a couple days but worse lately. Pain goes from hip to toes.  Hard to sleep. Took ibuprofen 2 hours ago. Took oxycodone without relief.

## 2021-07-15 ENCOUNTER — Emergency Department (HOSPITAL_COMMUNITY): Payer: No Typology Code available for payment source

## 2021-07-15 ENCOUNTER — Other Ambulatory Visit: Payer: Self-pay

## 2021-07-15 ENCOUNTER — Encounter (HOSPITAL_COMMUNITY): Payer: Self-pay

## 2021-07-15 DIAGNOSIS — Z79899 Other long term (current) drug therapy: Secondary | ICD-10-CM | POA: Diagnosis not present

## 2021-07-15 DIAGNOSIS — M5432 Sciatica, left side: Secondary | ICD-10-CM | POA: Diagnosis not present

## 2021-07-15 DIAGNOSIS — M533 Sacrococcygeal disorders, not elsewhere classified: Secondary | ICD-10-CM | POA: Diagnosis present

## 2021-07-15 NOTE — ED Triage Notes (Signed)
POV from home with cc of left hip/leg pain that has been going on since tues. Seen for the same multiple times. Pt states he cant sleeping or manage pain. Takes ibuprofen with no relief.

## 2021-07-16 ENCOUNTER — Emergency Department (HOSPITAL_COMMUNITY)
Admission: EM | Admit: 2021-07-16 | Discharge: 2021-07-16 | Disposition: A | Discharge status: Home / Self Care | Payer: No Typology Code available for payment source | Attending: Emergency Medicine | Admitting: Emergency Medicine

## 2021-07-16 DIAGNOSIS — M5432 Sciatica, left side: Secondary | ICD-10-CM

## 2021-07-16 MED ORDER — OXYCODONE-ACETAMINOPHEN 5-325 MG PO TABS
2.0000 | ORAL_TABLET | ORAL | 0 refills | Status: DC | PRN
Start: 2021-07-16 — End: 2022-01-24

## 2021-07-16 MED ORDER — DIAZEPAM 5 MG PO TABS: 5.0000 mg | ORAL_TABLET | Freq: Four times a day (QID) | ORAL | 0 refills | Status: DC | PRN

## 2021-07-16 MED ORDER — HYDROMORPHONE HCL 2 MG/ML IJ SOLN
2.0000 mg | Freq: Once | INTRAMUSCULAR | Status: AC
Start: 2021-07-16 — End: 2021-07-16
  Administered 2021-07-16: 2 mg via INTRAMUSCULAR
  Filled 2021-07-16: qty 1

## 2021-07-16 MED ORDER — KETOROLAC TROMETHAMINE 60 MG/2ML IM SOLN
60.0000 mg | Freq: Once | INTRAMUSCULAR | Status: AC
  Administered 2021-07-16: 60 mg via INTRAMUSCULAR
  Filled 2021-07-16: qty 2

## 2021-07-16 MED ORDER — OXYCODONE-ACETAMINOPHEN 5-325 MG PO TABS
2.0000 | ORAL_TABLET | ORAL | 0 refills | Status: DC | PRN
Start: 2021-07-16 — End: 2021-07-16

## 2021-07-16 NOTE — Discharge Instructions (Addendum)
Begin taking Percocet as prescribed as needed for pain.    Begin taking Valium as prescribed as needed for spasm.  Continue prednisone and meloxicam as previously prescribed.  Return to the emergency department if symptoms significantly worsen or change.

## 2021-07-16 NOTE — ED Provider Notes (Signed)
[facility] EMERGENCY DEPARTMENT Provider Note   CSN: [id number] Arrival date & time: 07/15/21  2021     History  Chief Complaint  Patient presents with   Left Hip/Leg pain     [facility] is a 61 y.o. male.  Patient is a 61 year old male with past medical history of pituitary insufficiency, hyperlipidemia.  Patient presenting today with complaints of pain in his left buttock radiating down his left leg.  This has been ongoing for the past week.  He was seen here earlier this week and had a shot of Dilaudid and Toradol which gave him limited relief.  He was then seen by his primary doctor and started on prednisone and meloxicam, but still describes severe pain he describes pain in his buttock radiating down his leg, but no weakness or numbness.  He denies any bowel or bladder complaints.  He denies any back pain and does tell me he had recently had an MRI of his lumbar spine showing bulging disks but no surgical problems.  The history is provided by the patient.      Home Medications Prior to Admission medications   Medication Sig Start Date End Date Taking? Authorizing Provider  albuterol (PROVENTIL) (2.5 MG/3ML) 0.083% nebulizer solution Take 3 mLs (2.5 mg total) by nebulization every 6 (six) hours as needed for wheezing or shortness of breath. 06/27/21 07/27/21  Chesley Mires, MD  allopurinol (ZYLOPRIM) 100 MG tablet Take 100 mg by mouth daily.    [provider]  atorvastatin (LIPITOR) 20 MG tablet Take 20 mg by mouth daily.    [provider]  furosemide (LASIX) 20 MG tablet Take 20 mg by mouth 2 (two) times daily. 02/28/21   [provider]  methylPREDNISolone (MEDROL DOSEPAK) 4 MG TBPK tablet Take as directed on package 07/13/21   Horton, Barbette Hair, MD  omeprazole (PRILOSEC) 20 MG capsule TAKE ONE CAPSULE BY MOUTH ONCE DAILY BEFORE A MEAL TO CONTROL STOMACH ACID.  TAKE 30 MINUTES BEFORE A MEAL 02/28/21   [provider]  oxyCODONE (ROXICODONE) 5  MG immediate release tablet Take 1 tablet (5 mg total) by mouth every 4 (four) hours as needed for severe pain or breakthrough pain. 06/20/21   Virl Cagey, MD  propranolol (INDERAL) 10 MG tablet Take 10 mg by mouth daily.    [provider]  Sertraline HCl (ZOLOFT PO) Take 1 tablet by mouth daily.    [provider]  tadalafil (CIALIS) 5 MG tablet Take 5 mg by mouth daily as needed for erectile dysfunction.    [provider]  tamsulosin (FLOMAX) 0.4 MG CAPS capsule Take 0.4 mg by mouth daily. 05/25/21   [provider]      Allergies    Patient has no known allergies.    Review of Systems   Review of Systems  All other systems reviewed and are negative.  Physical Exam Updated Vital Signs BP (!) 157/101 (BP Location: Right Arm)    Pulse 82    Temp (!) 97.4 F (36.3 C) (Oral)    Resp (!) 22    Ht 5\' 10"  (1.778 m)    Wt (!) 141 kg    SpO2 97%    BMI 44.60 kg/m  Physical Exam Vitals and nursing note reviewed.  Constitutional:      General: He is not in acute distress.    Appearance: Normal appearance. He is not ill-appearing.  HENT:     Head: Normocephalic and atraumatic.  Pulmonary:  Effort: Pulmonary effort is normal.  Musculoskeletal:     Comments: There is tenderness to palpation in the left buttock.  Skin:    General: Skin is warm and dry.  Neurological:     Mental Status: He is alert.     Comments: Strength is 5 out of 5 in both lower extremities.  He is able to ambulate, but with antalgic gait.  DTRs are trace and symmetrical in the patellar and Achilles tendons.  Sensation is intact throughout both lower legs.    ED Results / Procedures / Treatments   Labs (all labs ordered are listed, but only abnormal results are displayed) Labs Reviewed - No data to display  EKG None  Radiology DG Hip Unilat W or Wo Pelvis 2-3 Views Left  Result Date: 07/15/2021 CLINICAL DATA:  Left hip pain for several days, initial encounter EXAM:  DG HIP (WITH OR WITHOUT PELVIS) 3V LEFT COMPARISON:  None. FINDINGS: Pelvic ring is intact. Mild degenerative changes of the hip joints are noted bilaterally. No acute fracture or dislocation is noted. No soft tissue changes are seen. IMPRESSION: Mild degenerative change without acute abnormality. Electronically Signed   By: Inez Catalina M.D.   On: 07/15/2021 23:06    Procedures Procedures    Medications Ordered in ED Medications  ketorolac (TORADOL) injection 60 mg (has no administration in time range)  HYDROmorphone (DILAUDID) injection 2 mg (has no administration in time range)    ED Course/ Medical Decision Making/ A&P  Patient presenting with complaints of left leg and buttock pain most consistent with sciatica.  He is already on prednisone, meloxicam, and has taken Percocet from a leftover prescription, but continues to experience severe pain.  He is neurologically intact with no red flags that would suggest an emergent situation.  X-ray this evening is significant only for mild degenerative change.  Patient will be discharged with continued use of prednisone.  I will prescribe Percocet as well as Valium.  Final Clinical Impression(s) / ED Diagnoses Final diagnoses:  None    Rx / DC Orders ED Discharge Orders     None         Veryl Speak, MD 07/16/21 [phone]

## 2021-07-19 ENCOUNTER — Ambulatory Visit: Payer: No Typology Code available for payment source | Admitting: Internal Medicine

## 2021-07-31 ENCOUNTER — Encounter: Payer: Self-pay | Admitting: Internal Medicine

## 2021-07-31 ENCOUNTER — Ambulatory Visit: Payer: PPO | Admitting: Internal Medicine

## 2021-07-31 ENCOUNTER — Other Ambulatory Visit: Payer: Self-pay

## 2021-07-31 DIAGNOSIS — R058 Other specified cough: Secondary | ICD-10-CM

## 2021-07-31 DIAGNOSIS — Z9989 Dependence on other enabling machines and devices: Secondary | ICD-10-CM

## 2021-07-31 DIAGNOSIS — G4733 Obstructive sleep apnea (adult) (pediatric): Secondary | ICD-10-CM

## 2021-07-31 MED ORDER — PREDNISONE 10 MG PO TABS
ORAL_TABLET | ORAL | 0 refills | Status: AC
Start: 2021-07-31 — End: 2021-08-06

## 2021-07-31 MED ORDER — AMOXICILLIN-POT CLAVULANATE 875-125 MG PO TABS: 1.0000 | ORAL_TABLET | Freq: Two times a day (BID) | ORAL | 0 refills | Status: AC
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
## 2021-07-31 NOTE — Patient Instructions (Addendum)
When cough flares for any reason omeprazole Take 30- 60 min before your first and last meals of the day and when better then once daily  For cough > mucinex dm 1200 mg every 12 hours as needed and ok to supplement with oxycodone up to every 4 hours as needed   Augmentin 875 mg take one pill twice daily  X 10 days - take at breakfast and supper with large glass of water.  It would help reduce the usual side effects (diarrhea and yeast infections) if you ate cultured yogurt at lunch.   Prednisone 10 mg take  4 each am x 2 days,   2 each am x 2 days,  1 each am x 2 days and stop   For drainage / throat tickle try take CHLORPHENIRAMINE  4 mg  ("Allergy Relief" 4mg   at [facility] should be easiest to find in the blue box usually on bottom shelf)  take one every 4 hours as needed - extremely effective and inexpensive over the counter- may cause drowsiness so start with just a dose or two an hour before bedtime and see how you tolerate it before trying in daytime.   For difficulty breathing ok to try the albuterol nebulizer up to ever 4 hours but call for follow up as this is not intended to be a long term or "preventive" treatment   Pulmonary follow up is as needed - keep up with appts with Dr Halford Chessman

## 2021-07-31 NOTE — Assessment & Plan Note (Signed)
Dr Halford Chessman follows   Referred back to Dr Halford Chessman for all cpap related issues and return here is prn flare not corrected with above "action plan"          Each maintenance medication was reviewed in detail including emphasizing most importantly the difference between maintenance and prns and under what circumstances the prns are to be triggered using an action plan format where appropriate.  Total time for H and P, chart review, counseling, reviewing neb device(s) and generating customized AVS unique to this summary final office visit / same day charting  > 30 min

## 2021-09-13 ENCOUNTER — Ambulatory Visit: Payer: PPO | Admitting: Pulmonary Disease

## 2021-09-13 ENCOUNTER — Encounter: Payer: Self-pay | Admitting: Pulmonary Disease

## 2021-09-13 ENCOUNTER — Other Ambulatory Visit: Payer: Self-pay

## 2021-09-13 VITALS — BP 142/88 | HR 92 | Temp 98.2°F | Ht 70.0 in | Wt 293.6 lb

## 2021-09-13 DIAGNOSIS — Z9989 Dependence on other enabling machines and devices: Secondary | ICD-10-CM

## 2021-09-13 DIAGNOSIS — R058 Other specified cough: Secondary | ICD-10-CM

## 2021-09-13 DIAGNOSIS — E669 Obesity, unspecified: Secondary | ICD-10-CM

## 2021-09-13 DIAGNOSIS — G4733 Obstructive sleep apnea (adult) (pediatric): Secondary | ICD-10-CM | POA: Diagnosis not present

## 2021-09-13 DIAGNOSIS — G4730 Sleep apnea, unspecified: Secondary | ICD-10-CM | POA: Diagnosis not present

## 2021-09-13 MED ORDER — AZELASTINE HCL 0.15 % NA SOLN: 1.0000 | Freq: Every morning | NASAL | Status: DC

## 2021-09-13 MED ORDER — FLUTICASONE PROPIONATE 50 MCG/ACT NA SUSP
1.0000 | Freq: Every evening | NASAL | 2 refills | Status: DC
Start: 2021-09-13 — End: 2022-05-16

## 2021-09-13 NOTE — Progress Notes (Signed)
? ?[facility] ? ?Chief Complaint  ?Patient presents with  ? Follow-up  ?  Cpap working well   ? ? ?Constitutional:  ?BP (!) 142/88 (BP Location: Left Arm, Patient Position: Sitting)   Pulse 92   Temp 98.2 ?F (36.8 ?C) (Temporal)   Ht '5\' 10"'$  (1.778 m)   Wt 293 lb 9.6 oz (133.2 kg)   SpO2 97% Comment: ra  BMI 42.13 kg/m?  ? ?Past Medical History:  ?Chronic back pain, Chronic fatigue, PTSD, GERD, Gout, HLD, ED, Depression, ADHD, Vocal cord dysfunction ? ?Past Surgical History:  ?He  has a past surgical history that includes Laryngoscopy. ? ?Brief Summary:  ?Alexander Gutierrez is a 61 y.o. male veteran with obstructive sleep apnea and upper airway cough syndrome. ?  ? ? ? ?Subjective:  ? ?He got replacement respironics machine and is staying with the [place].  No issues with mask fit or pressure.  Sleeping well. ? ?Has sinus pressure and feels ears congested.  Has post nasal drip and has to clear his throat frequently.  Uses afrin once per week.  Has allergy pills that help some.  Using nasal saline spray. ? ?Physical Exam:  ? ?Appearance - well kempt  ? ?ENMT - no sinus tenderness, no oral exudate, no LAN, Mallampati 3 airway, no stridor ? ?Respiratory - equal breath sounds bilaterally, no wheezing or rales ? ?CV - s1s2 regular rate and rhythm, no murmurs ? ?Ext - no clubbing, no edema ? ?Skin - no rashes ? ?Psych - normal mood and affect ? ?  ?Pulmonary testing:  ?IgE 01/02/21 >> 40 ? ?Chest Imaging:  ?CT angio chest 05/16/20 >> diffuse airway thickening, fatty liver ? ?Sleep Tests:  ?HST 06/17/21 >> AHI 59, SpO2 low 76% ? ?Cardiac Tests:  ?Echo 03/01/21 >> EF 55 to 60%, mild LVH ? ?Social History:  ?He  reports that he has never smoked. He has never used smokeless tobacco. He reports that he does not drink alcohol and does not use drugs. ? ?Family History:  ?His family history is not on file. ?  ? ? ?Assessment/Plan:  ? ?Obstructive sleep apnea. ?- he is compliant with CPAP and  reports benefit from therapy ?- gets supplies through the [place] ?- will call him with results of his CPAP download ? ?Upper airway cough with vocal cord dysfunction. ?- was on allergy shots as a child ?- had toxic fume exposure in [place] in 1991 ?- continue nasal irrigation ?- add astepro in the morning and flonase in the evening ?- he will call with the names of his allergies pills ? ?Obesity. ?- he is aware of how his weight can impact his health, particularly in relation to sleep apnea ? ?Time Spent Involved in Patient Care on Day of Examination:  ?28 minutes ? ?Follow up:  ? ?Patient Instructions  ?Astepro 1 spray in each nostril in the morning ? ?Flonase 1 spray in each nostril in the evening ? ?Will call with results of your CPAP download ? ?Call with the names of your allergy pills ? ?Follow up in 1 year ? ?Medication List:  ? ?Allergies as of 09/13/2021   ?No Known Allergies ?  ? ?  ?Medication List  ?  ? ?  ? Accurate as of September 13, 2021  9:26 AM. If you have any questions, ask your nurse or doctor.  ?  ?  ? ?  ? ?STOP taking these medications   ? ?methylPREDNISolone 4  MG Tbpk tablet ?Commonly known as: MEDROL DOSEPAK ?Stopped by: Chesley Mires, MD ?  ? ?  ? ?TAKE these medications   ? ?albuterol (2.5 MG/3ML) 0.083% nebulizer solution ?Commonly known as: PROVENTIL ?Take 3 mLs (2.5 mg total) by nebulization every 6 (six) hours as needed for wheezing or shortness of breath. ?  ?allopurinol 100 MG tablet ?Commonly known as: ZYLOPRIM ?Take 100 mg by mouth daily. ?  ?atorvastatin 20 MG tablet ?Commonly known as: LIPITOR ?Take 20 mg by mouth daily. ?  ?Azelastine HCl 0.15 % Soln ?Commonly known as: Astepro ?Place 1 spray into the nose in the morning. ?Started by: Chesley Mires, MD ?  ?diazepam 5 MG tablet ?Commonly known as: Valium ?Take 1 tablet (5 mg total) by mouth every 6 (six) hours as needed for anxiety (spasms). ?  ?fluticasone 50 MCG/ACT nasal spray ?Commonly known as: FLONASE ?Place 1 spray into both nostrils  every evening. ?Started by: Chesley Mires, MD ?  ?furosemide 20 MG tablet ?Commonly known as: LASIX ?Take 20 mg by mouth 2 (two) times daily. ?  ?meloxicam 7.5 MG tablet ?Commonly known as: MOBIC ?Take 15 mg by mouth daily as needed. ?  ?omeprazole 20 MG capsule ?Commonly known as: PRILOSEC ?TAKE ONE CAPSULE BY MOUTH ONCE DAILY BEFORE A MEAL TO CONTROL STOMACH ACID.  TAKE 30 MINUTES BEFORE A MEAL ?  ?oxyCODONE 5 MG immediate release tablet ?Commonly known as: Roxicodone ?Take 1 tablet (5 mg total) by mouth every 4 (four) hours as needed for severe pain or breakthrough pain. ?  ?oxyCODONE-acetaminophen 5-325 MG tablet ?Commonly known as: Percocet ?Take 2 tablets by mouth every 4 (four) hours as needed. ?  ?propranolol 10 MG tablet ?Commonly known as: INDERAL ?Take 10 mg by mouth daily. ?  ?tadalafil 5 MG tablet ?Commonly known as: CIALIS ?Take 5 mg by mouth daily as needed for erectile dysfunction. ?  ?tamsulosin 0.4 MG Caps capsule ?Commonly known as: FLOMAX ?Take 0.4 mg by mouth daily. ?  ?ZOLOFT PO ?Take 1 tablet by mouth daily. ?  ? ?  ? ? ?Signature:  ?Chesley Mires, MD ?[place] ?Pager - [phone] ?09/13/2021, 9:26 AM ?  ? ? ? ? ? ? ? ? ?

## 2021-09-13 NOTE — Patient Instructions (Signed)
Astepro 1 spray in each nostril in the morning ? ?Flonase 1 spray in each nostril in the evening ? ?Will call with results of your CPAP download ? ?Call with the names of your allergy pills ? ?Follow up in 1 year ?

## 2021-11-29 ENCOUNTER — Emergency Department (HOSPITAL_COMMUNITY): Payer: No Typology Code available for payment source

## 2021-11-29 ENCOUNTER — Encounter (HOSPITAL_COMMUNITY): Payer: Self-pay | Admitting: *Deleted

## 2021-11-29 ENCOUNTER — Other Ambulatory Visit: Payer: Self-pay

## 2021-11-29 ENCOUNTER — Emergency Department (HOSPITAL_COMMUNITY)
Admission: EM | Admit: 2021-11-29 | Discharge: 2021-11-29 | Disposition: A | Discharge status: Home / Self Care | Payer: No Typology Code available for payment source | Attending: Student | Admitting: Student

## 2021-11-29 DIAGNOSIS — R109 Unspecified abdominal pain: Secondary | ICD-10-CM | POA: Diagnosis present

## 2021-11-29 DIAGNOSIS — K439 Ventral hernia without obstruction or gangrene: Secondary | ICD-10-CM | POA: Diagnosis not present

## 2021-11-29 DIAGNOSIS — R600 Localized edema: Secondary | ICD-10-CM | POA: Insufficient documentation

## 2021-11-29 DIAGNOSIS — R1010 Upper abdominal pain, unspecified: Secondary | ICD-10-CM

## 2021-11-29 HISTORY — DX: Unspecified cirrhosis of liver: K74.60

## 2021-11-29 LAB — URINALYSIS, ROUTINE W REFLEX MICROSCOPIC
Bilirubin Urine: NEGATIVE
Glucose, UA: NEGATIVE mg/dL
Hgb urine dipstick: NEGATIVE
Ketones, ur: NEGATIVE mg/dL
Leukocytes,Ua: NEGATIVE
Nitrite: NEGATIVE
Protein, ur: NEGATIVE mg/dL
Specific Gravity, Urine: 1.017 (ref 1.005–1.030)
pH: 7 (ref 5.0–8.0)

## 2021-11-29 LAB — CBC
HCT: 45.4 % (ref 39.0–52.0)
Hemoglobin: 15.9 g/dL (ref 13.0–17.0)
MCH: 31.2 pg (ref 26.0–34.0)
MCHC: 35 g/dL (ref 30.0–36.0)
MCV: 89.2 fL (ref 80.0–100.0)
Platelets: 224 10*3/uL (ref 150–400)
RBC: 5.09 MIL/uL (ref 4.22–5.81)
RDW: 12.6 % (ref 11.5–15.5)
WBC: 6.7 10*3/uL (ref 4.0–10.5)
nRBC: 0 % (ref 0.0–0.2)

## 2021-11-29 LAB — COMPREHENSIVE METABOLIC PANEL
ALT: 41 U/L (ref 0–44)
AST: 26 U/L (ref 15–41)
Albumin: 4.2 g/dL (ref 3.5–5.0)
Alkaline Phosphatase: 49 U/L (ref 38–126)
Anion gap: 8 (ref 5–15)
BUN: 19 mg/dL (ref 6–20)
CO2: 27 mmol/L (ref 22–32)
Calcium: 8.9 mg/dL (ref 8.9–10.3)
Chloride: 104 mmol/L (ref 98–111)
Creatinine, Ser: 1.02 mg/dL (ref 0.61–1.24)
GFR, Estimated: >60 mL/min (ref >60)
Glucose, Bld: 109 mg/dL — ABNORMAL HIGH (ref 70–99)
Potassium: 4.4 mmol/L (ref 3.5–5.1)
Sodium: 139 mmol/L (ref 135–145)
Total Bilirubin: 1 mg/dL (ref 0.3–1.2)
Total Protein: 6.9 g/dL (ref 6.5–8.1)

## 2021-11-29 LAB — LIPASE, BLOOD: Lipase: 24 U/L (ref 11–51)

## 2021-11-29 LAB — AMMONIA: Ammonia: 16 umol/L (ref 9–35)

## 2021-11-29 MED ORDER — IOHEXOL 300 MG/ML  SOLN
100.0000 mL | Freq: Once | INTRAMUSCULAR | Status: AC | PRN
  Administered 2021-11-29: 100 mL via INTRAVENOUS

## 2021-11-29 NOTE — ED Provider Notes (Signed)
[facility] EMERGENCY DEPARTMENT Provider Note  CSN: [id number] Arrival date & time: 11/29/21 1111  Chief Complaint(s) Abdominal Pain  HPI Alexander Gutierrez is a 61 y.o. male with PMH chronic back pain, cirrhosis, GERD, gout, HLD, PTSD who presents the emergency department for evaluation of abdominal bloating and swelling.  Patient was seen by his primary care physician today after concern for a mass in his abdomen.  He states that he woke up with abdominal bloating and noticed a protrusion from his abdomen.  He has not had surgery in his abdomen and does not have a history of a ventral hernia.  Given his history of cirrhosis, primary care physician concerned about new onset ascites and sent the patient to the emergency department for further work-up.  He denies nausea, vomiting, chest pain, shortness of breath, headache, fever, or other systemic symptoms.  He does endorse mild worsening of his lower extremity edema but denies orthopnea or worsening shortness of breath.  Of note, patient states he has been on vacation and has not taken his Lasix in 2 weeks.  Patient states he is also concerned about intermittent confusion and wants his ammonia checked.   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 Authorizing Provider   albuterol (PROVENTIL) (2.5 MG/3ML) 0.083% nebulizer solution Take 3 mLs (2.5 mg total) by nebulization every 6 (six) hours as needed for wheezing or shortness of breath. 06/27/21 07/27/21  Chesley Mires, MD  allopurinol (ZYLOPRIM) 100 MG tablet Take 100 mg by mouth daily.    [provider]  atorvastatin (LIPITOR) 20 MG tablet Take 20 mg by mouth daily.    [provider]  Azelastine HCl (ASTEPRO) 0.15 % SOLN Place 1 spray into the nose in the morning. 09/13/21   Chesley Mires, MD  diazepam (VALIUM) 5 MG tablet Take 1 tablet (5 mg total) by mouth every 6 (six) hours as needed for anxiety (spasms). 07/16/21   Veryl Speak, MD  fluticasone (FLONASE) 50 MCG/ACT nasal spray Place 1 spray into both nostrils every evening. 09/13/21   Chesley Mires, MD  furosemide (LASIX) 20 MG tablet Take 20 mg by mouth 2 (two) times daily. 02/28/21   [provider]  meloxicam (MOBIC) 7.5 MG tablet Take 15 mg by mouth daily as needed. 07/13/21   [provider]  omeprazole (PRILOSEC) 20 MG capsule TAKE ONE CAPSULE BY MOUTH ONCE DAILY BEFORE A MEAL TO CONTROL STOMACH ACID.  TAKE 30 MINUTES BEFORE A MEAL 02/28/21   [provider]  oxyCODONE (ROXICODONE) 5 MG immediate release tablet Take 1 tablet (5 mg total) by mouth every 4 (four) hours as needed for severe pain or breakthrough pain. 06/20/21   Virl Cagey, MD  oxyCODONE-acetaminophen (PERCOCET) 5-325 MG  tablet Take 2 tablets by mouth every 4 (four) hours as needed. 07/16/21   Veryl Speak, MD  propranolol (INDERAL) 10 MG tablet Take 10 mg by mouth daily.    [provider]  Sertraline HCl (ZOLOFT PO) Take 1 tablet by mouth daily.    [provider]  tadalafil (CIALIS) 5 MG tablet Take 5 mg by mouth daily as needed for erectile dysfunction.    [provider]  tamsulosin (FLOMAX) 0.4 MG CAPS capsule Take 0.4 mg by mouth daily. 05/25/21   [provider]                                                                                                                                     Past Surgical History Past Surgical History:  Procedure Laterality Date   LARYNGOSCOPY     Family History History reviewed. No pertinent family history.  Social History Social History   Tobacco Use   Smoking status: Never   Smokeless tobacco: Never  Substance Use Topics   Alcohol use: No   Drug use: No   Allergies Patient has no known allergies.  Review of Systems Review of Systems  Cardiovascular:  Positive for leg swelling.  Gastrointestinal:  Positive for abdominal distention.    Physical Exam Vital Signs  I have reviewed the triage vital signs BP 109/62 (BP Location: Right Arm)   Pulse 81   Temp 98 F (36.7 C) (Oral)   Resp 17   Ht '5\' 10"'$  (1.778 m)   Wt 131.5 kg   SpO2 98%   BMI 41.61 kg/m   Physical Exam Constitutional:      General: He is not in acute distress.    Appearance: Normal appearance.  HENT:     Head: Normocephalic and atraumatic.     Nose: No congestion or rhinorrhea.  Eyes:     General:        Right eye: No discharge.        Left eye: No discharge.     Extraocular Movements: Extraocular movements intact.     Pupils: Pupils are equal, round, and reactive to light.  Cardiovascular:     Rate and Rhythm: Normal rate and regular rhythm.     Heart sounds: No murmur heard. Pulmonary:     Effort: No respiratory distress.     Breath sounds: No wheezing or rales.  Abdominal:     General: There is distension.     Tenderness: There is no abdominal tenderness.     Hernia: A hernia is present. Hernia is present in the ventral area.  Musculoskeletal:        General: Normal range of motion.     Cervical back: Normal range of motion.     Right lower leg: Edema present.     Left lower leg: Edema present.  Skin:    General: Skin is warm and dry.  Neurological:  General: No focal deficit present.     Mental Status: He is alert.     ED Results and  Treatments Labs (all labs ordered are listed, but only abnormal results are displayed) Labs Reviewed  COMPREHENSIVE METABOLIC PANEL - Abnormal; Notable for the following components:      Result Value   Glucose, Bld 109 (*)    All other components within normal limits  LIPASE, BLOOD  CBC  URINALYSIS, ROUTINE W REFLEX MICROSCOPIC  AMMONIA                                                                                                                          Radiology CT ABDOMEN PELVIS W CONTRAST  Result Date: 11/29/2021 CLINICAL DATA:  Abdominal pain and distention. EXAM: CT ABDOMEN AND PELVIS WITH CONTRAST TECHNIQUE: Multidetector CT imaging of the abdomen and pelvis was performed using the standard protocol following bolus administration of intravenous contrast. RADIATION DOSE REDUCTION: This exam was performed according to the departmental dose-optimization program which includes automated exposure control, adjustment of the mA and/or kV according to patient size and/or use of iterative reconstruction technique. CONTRAST:  198m OMNIPAQUE IOHEXOL 300 MG/ML  SOLN COMPARISON:  May 15, 2020. FINDINGS: Lower chest: No acute abnormality. Hepatobiliary: Hepatic steatosis. Stable left hepatic cyst. No gallstones or biliary dilatation is noted. Pancreas: Unremarkable. No pancreatic ductal dilatation or surrounding inflammatory changes. Spleen: Normal in size without focal abnormality. Adrenals/Urinary Tract: Adrenal glands are unremarkable. Kidneys are normal, without renal calculi, focal lesion, or hydronephrosis. Bladder is unremarkable. Stomach/Bowel: Stomach is within normal limits. Appendix appears normal. No evidence of bowel wall thickening, distention, or inflammatory changes. Vascular/Lymphatic: Aortic atherosclerosis. No enlarged abdominal or pelvic lymph nodes. Reproductive: Prostate is unremarkable. Other: No abdominal wall hernia or abnormality. No abdominopelvic ascites. Musculoskeletal:  No acute or significant osseous findings. IMPRESSION: Hepatic steatosis. No acute abnormality seen in the abdomen or pelvis. Aortic Atherosclerosis (ICD10-I70.0). Electronically Signed   By: JMarijo ConceptionM.D.   On: 11/29/2021 15:55    Pertinent labs & imaging results that were available during my care of the patient were reviewed by me and considered in my medical decision making (see MDM for details).  Medications Ordered in ED Medications  iohexol (OMNIPAQUE) 300 MG/ML solution 100 mL (100 mLs Intravenous Contrast Given 11/29/21 1535)  Procedures Ultrasound ED Abd  Date/Time: 11/29/2021 8:37 PM  Performed by: Teressa Lower, MD Authorized by: Teressa Lower, MD   Procedure details:    Assessment for:  Intra-abdominal fluid    Images: not archived    Comments:     No visible ascites on bedside ultrasound   (including critical care time)  Medical Decision Making / ED Course   This patient presents to the ED for concern of abdominal bloating and swelling, this involves an extensive number of treatment options, and is a complaint that carries with it a high risk of complications and morbidity.  The differential diagnosis includes ventral hernia, ascites, intestinal gas, obstruction, intra-abdominal mass  MDM: Patient seen in the emergency department for evaluation of abdominal bloating and distention.  Physical exam with abdominal distention and a noticeable ventral hernia when the patient bears down but is otherwise unremarkable.  Ventral hernia is not incarcerated at this time.  Laboratory evaluation unremarkable including negative LFTs and ammonia.  Bedside ultrasound with no abdominal ascites.  CT abdomen pelvis pending at time of signout.  Please see provider signout for continuation of work-up.  Anticipate discharge home if CT  negative.   Additional history obtained:  -External records from outside source obtained and reviewed including: Chart review including previous notes, labs, imaging, consultation notes   Lab Tests: -I ordered, reviewed, and interpreted labs.   The pertinent results include:   Labs Reviewed  COMPREHENSIVE METABOLIC PANEL - Abnormal; Notable for the following components:      Result Value   Glucose, Bld 109 (*)    All other components within normal limits  LIPASE, BLOOD  CBC  URINALYSIS, ROUTINE W REFLEX MICROSCOPIC  AMMONIA       Imaging Studies ordered: I ordered imaging studies including CTAP I independently visualized and interpreted imaging. I agree with the radiologist interpretation   Medicines ordered and prescription drug management: Meds ordered this encounter  Medications   iohexol (OMNIPAQUE) 300 MG/ML solution 100 mL    -I have reviewed the patients home medicines and have made adjustments as needed  Critical interventions none   Cardiac Monitoring: The patient was maintained on a cardiac monitor.  I personally viewed and interpreted the cardiac monitored which showed an underlying rhythm of: NSR   Social Determinants of Health:  Factors impacting patients care include: none   Reevaluation: After the interventions noted above, I reevaluated the patient and found that they have :stayed the same  Co morbidities that complicate the patient evaluation  Past Medical History:  Diagnosis Date   ADHD    Chronic back pain    Chronic fatigue    Cirrhosis (HCC)    Depression    Erectile dysfunction    GERD (gastroesophageal reflux disease)    Gout    Hypercholesteremia    OSA (obstructive sleep apnea)    PTSD (post-traumatic stress disorder)    Vocal cord dysfunction       Dispostion: I considered admission for this patient, and disposition pending CT imaging.  Anticipate discharge if CT negative.     Final Clinical Impression(s) / ED  Diagnoses Final diagnoses:  Upper abdominal pain     '@PCDICTATION'$ @    Teressa Lower, MD 11/29/21 2041

## 2021-11-29 NOTE — ED Triage Notes (Signed)
Pt with abd pain, distended with nausea.  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  Pt sent here for lab work as well.  Admits some confusion-pt able to state place, month and year.

## 2021-11-29 NOTE — ED Provider Notes (Signed)
No obvious hernia on CT.  I have personally viewed/interpreted these images.  No ascites either.  Labs show normal LFTs, hemoglobin, WBC, etc.  At this point he appears stable for discharge.  Given return precautions.   Sherwood Gambler, MD 11/29/21 [phone]

## 2021-11-29 NOTE — ED Provider Triage Note (Signed)
Emergency Medicine Provider Triage Evaluation Note  Alexander Gutierrez , a 61 y.o. male  was evaluated in triage.  Pt complains of abdominal bloating and nausea over the past 1 to 2 weeks.  Patient reports that he had liver biopsy done and was diagnosed with cirrhosis stage IV.  He reports he has been having some headaches with some minor confusion.  Denies any vomiting, chest pain, shortness of breath, diarrhea, constipation..  Review of Systems  Positive:  Negative:   Physical Exam  BP 129/70 (BP Location: Right Arm)   Pulse 81   Temp 97.7 F (36.5 C) (Oral)   Resp 20   Ht '5\' 10"'$  (1.778 m)   Wt 131.5 kg   SpO2 95%   BMI 41.61 kg/m  Gen:   Awake, no distress   Resp:  Normal effort  MSK:   Moves extremities without difficulty  Other:  Abdomen is firm but pliable.  Nontender.  Normal active bowel sounds.  Medical Decision Making  Medically screening exam initiated at 12:01 PM.  Appropriate orders placed.  Domnic Vantol was informed that the remainder of the evaluation will be completed by another provider, this initial triage assessment does not replace that evaluation, and the importance of remaining in the ED until their evaluation is complete.  We will order abdominal labs and CT of the abdomen   Sherrell Puller, PA-C 11/29/21 1202

## 2021-11-29 NOTE — Discharge Instructions (Signed)
If you develop worsening, continued, or recurrent abdominal pain, uncontrolled vomiting, fever, chest or back pain, or any other new/concerning symptoms then return to the ER for evaluation.  

## 2021-12-06 ENCOUNTER — Ambulatory Visit (INDEPENDENT_AMBULATORY_CARE_PROVIDER_SITE_OTHER): Payer: No Typology Code available for payment source | Admitting: Allergy & Immunology

## 2021-12-06 ENCOUNTER — Encounter: Payer: Self-pay | Admitting: Allergy & Immunology

## 2021-12-06 ENCOUNTER — Other Ambulatory Visit: Payer: Self-pay

## 2021-12-06 VITALS — BP 130/84 | HR 99 | Temp 98.3°F | Resp 16 | Ht 70.0 in | Wt 298.0 lb

## 2021-12-06 DIAGNOSIS — J3089 Other allergic rhinitis: Secondary | ICD-10-CM | POA: Diagnosis not present

## 2021-12-06 DIAGNOSIS — J310 Chronic rhinitis: Secondary | ICD-10-CM

## 2021-12-06 DIAGNOSIS — R0602 Shortness of breath: Secondary | ICD-10-CM

## 2021-12-06 DIAGNOSIS — J302 Other seasonal allergic rhinitis: Secondary | ICD-10-CM

## 2021-12-06 MED ORDER — EPINEPHRINE 0.3 MG/0.3ML IJ SOAJ: 0.3000 mg | INTRAMUSCULAR | 1 refills | Status: DC | PRN

## 2021-12-06 MED ORDER — TRIAMCINOLONE ACETONIDE 0.1 % EX CREA: 1.0000 | TOPICAL_CREAM | Freq: Two times a day (BID) | CUTANEOUS | 1 refills | Status: DC

## 2021-12-06 MED ORDER — CETIRIZINE HCL 10 MG PO TABS: 10.0000 mg | ORAL_TABLET | Freq: Two times a day (BID) | ORAL | 5 refills | Status: DC

## 2021-12-06 NOTE — Progress Notes (Signed)
NEW PATIENT  Date of Service/Encounter:  12/06/21  Consult requested by: Beverly Milch, NP   Assessment:   SOB (shortness of breath)   OSA - on CPAP (followed by Dr. Halford Chessman)  Seasonal and perennial allergic rhinitis (grasses, ragweed, weeds, indoor molds, outdoor molds, dust mites, and cat)  Chronic back pain  PTSD  Plan/Recommendations:   1. SOB (shortness of breath) - Lung testing was in the high 60% range, but there was no improvement with albuterol treatment.  - I am not convinced that this is asthma either, but I would continue with the Spiriva two puffs once daily as directed by your PCP to see if this helps.  - I am going to start with a chest X-ray and make changes from there.  2. Chronic rhinitis - with itching - Testing today showed: grasses, ragweed, weeds, indoor molds, outdoor molds, dust mites, and cat. - Copy of test results provided.  - Avoidance measures provided. - Continue with: Zyrtec (cetirizine) '10mg'$  tablet BUT INCREASE to one tablet twice daly - Start taking: Flonase (fluticasone) one spray per nostril daily (AIM FOR EAR ON EACH SIDE) - You can use an extra dose of the antihistamine, if needed, for breakthrough symptoms.  - Consider nasal saline rinses 1-2 times daily to remove allergens from the nasal cavities as well as help with mucous clearance (this is especially helpful to do before the nasal sprays are given) - We are going to start allergy shots as a means of long-term control. - Allergy shots "re-train" and "reset" the immune system to ignore environmental allergens and decrease the resulting immune response to those allergens (sneezing, itchy watery eyes, runny nose, nasal congestion, etc).    - Allergy shots improve symptoms in 75-85% of patients.  - Make an appointment to start allergy shots in 2-3 weeks.  3. Pruritis (itching) - Start triamcinolone cream twice daily as needed to the itchy areas. - Increasing the cetirizine should help  as well.  4. Return in about 2 months (around 02/05/2022).   This note in its entirety was forwarded to the Provider who requested this consultation.  Subjective:   Iris Tatsch is a 61 y.o. male presenting today for evaluation of  Chief Complaint  Patient presents with   Other    Shortness of breath was told it was not asthma - has done stress test and pulmonary testing.    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  Yvan Dority was referred by Hyler, Gwen Her, NP.     Abshir is a 61 y.o. male presenting for an evaluation of asthma and SOB .   Asthma/Respiratory Symptom History: He has had SOB since the [place] War syndrome. It is very intermittent. He will have months of nothing and then he will have constant symptoms for a number of months. There is no rhyme or reason to any of this. He saw Pulmonology at the [facility]  over the years and this has been negative. He was stopped on his inhalers. He then had them added again recently by his PCP. He is not clear whether they help at all.  He reports that he starts to "fluidy" and then he has to get on prednisone and azithromycin. This clears it up and this happens 2-3 times per year. He has his own nebulizer at home and he uses it whenever he thinks about it. He is now on Spiriva two puffs once daily. He started this relatively recently and he is unsure whether it helped at all.   Allergic Rhinitis Symptom  History: He has some congestion that is worse at night. It affects his breathing and 2-3 nights per week, he is up around 2 in the morning to get tired enough to overcome this.  He has not seen ENT yet. He is going for a visit for PCP soon and he will ask about ENT. He was tested when he was a child. He was on allergy shots for 3 years and has had no allergy issues for the most part since the last few decades or so. He does cetirizine once daily. He was on loratadine at some point, but now he is cetirizine. He does have Astepro to use as needed.   He had a sinus CT in August 2022 that was unremarkable. He also had a chest CT in November 2021 that demonstrated the following:  IMPRESSION: 1. No evidence of pulmonary embolism. 2. Mild airways thickening, could reflect acute or chronic bronchitis versus reactive airways disease. 3. No other acute intrathoracic process. 4. Hepatic steatosis. 5. Mild bilateral perinephric stranding, nonspecific and possibly related to age or diminished renal function though can be seen in the setting of urinary tract infection. Correlate for urinary symptoms and consider urinalysis as clinically warranted.  He has a lot of back issues. He has back issues several times per year. They are starting physical therapy to strengthen his low back muscles to keep them from going out as much.   He has been 100% [employer] for PTSD. He was able to be productive for ten years or so after the [place] War. But then the PTSD caused him to apply for disability through the [place].   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 He has a lot of itching over his arms and legs. This is presumably from his chemical exposures as well.   Otherwise, there is no history of other atopic diseases, including food allergies, drug allergies, stinging insect allergies, eczema, urticaria, or contact dermatitis. There is no significant infectious history. Vaccinations  are up to date.    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 If you have any questions, ask your nurse or doctor.          albuterol (2.5 MG/3ML) 0.083% nebulizer solution Commonly known as: PROVENTIL Take 3 mLs (2.5 mg  total) by nebulization every 6 (six) hours as needed for wheezing or shortness of breath.   allopurinol 100 MG tablet Commonly known as: ZYLOPRIM Take 100 mg by mouth daily.   atorvastatin 20 MG tablet Commonly known as: LIPITOR Take 20 mg by mouth daily.   Azelastine HCl 0.15 % Soln Commonly known as: Astepro Place 1 spray into the nose in the morning.   cetirizine 10 MG tablet Commonly known as: ZYRTEC Take 1 tablet (10 mg total) by mouth 2 (two) times daily. Started by: Valentina Shaggy, MD   diazepam 5 MG tablet Commonly known as: Valium Take 1 tablet (5 mg total) by mouth every 6 (six) hours as needed for anxiety (spasms).   EPINEPHrine 0.3 mg/0.3 mL Soaj injection Commonly known as: EPI-PEN Inject 0.3 mg into the muscle as needed for anaphylaxis. Started by: Valentina Shaggy, MD   fluticasone 50 MCG/ACT nasal spray Commonly known as: FLONASE Place 1 spray into both nostrils every evening.   furosemide 20 MG tablet Commonly known as: LASIX Take 20 mg by mouth 2 (two) times daily.   meloxicam 7.5 MG tablet Commonly known as: MOBIC Take 15 mg by mouth daily as needed.   omeprazole 20 MG capsule Commonly known as: PRILOSEC TAKE ONE  CAPSULE BY MOUTH ONCE DAILY BEFORE A MEAL TO CONTROL STOMACH ACID.  TAKE 30 MINUTES BEFORE A MEAL   oxyCODONE 5 MG immediate release tablet Commonly known as: Roxicodone Take 1 tablet (5 mg total) by mouth every 4 (four) hours as needed for severe pain or breakthrough pain.   oxyCODONE-acetaminophen 5-325 MG tablet Commonly known as: Percocet Take 2 tablets by mouth every 4 (four) hours as needed.   propranolol 10 MG tablet Commonly known as: INDERAL Take 10 mg by mouth daily.   tadalafil 5 MG tablet Commonly known as: CIALIS Take 5 mg by mouth daily as needed for erectile dysfunction.   tamsulosin 0.4 MG Caps capsule Commonly known as: FLOMAX Take 0.4 mg by mouth daily.   triamcinolone cream 0.1 % Commonly known as: KENALOG Apply 1 Application topically 2 (two) times daily. Started by: Valentina Shaggy, MD   ZOLOFT PO Take 1 tablet by mouth daily.        Birth History: non-contributory  Developmental History: non-contributory  Past Surgical History: Past Surgical History:  Procedure Laterality Date   LARYNGOSCOPY       Family History: History reviewed. No pertinent family history.   Social History: Dani lives at home with his family.  He lives in a house that is 61 years old.  There is carpeting throughout the home.  He has gassing and central cooling.  There are no indoor animals.  There are outdoor wild animals.  There are no dust mite covers on the bedding.  There is no tobacco exposure.  He is currently on disability from the [place].  They do use a HEPA filter in the home.  They do not live near an interstate or industrial area. He grew up in [place], [place].    Review of Systems  Constitutional: Negative.  Negative for chills, fever, malaise/fatigue and weight loss.  HENT: Negative.  Negative for congestion, ear discharge and ear pain.   Eyes:  Negative for pain, discharge and redness.  Respiratory:  Negative for cough, sputum production, shortness of  breath and wheezing.   Cardiovascular: Negative.  Negative for chest pain and palpitations.  Gastrointestinal:  Negative for abdominal pain, constipation, diarrhea, heartburn, nausea and vomiting.  Skin: Negative.  Negative for itching and rash.  Neurological:  Negative for dizziness and headaches.  Endo/Heme/Allergies:  Negative for environmental allergies. Does not bruise/bleed easily.       Objective:   Blood pressure 130/84, pulse 99, temperature 98.3 F (36.8 C), resp. rate 16, height '5\' 10"'$  (1.778 m), weight 298 lb (135.2 kg), SpO2 97 %. Body mass index is 42.76 kg/m.     Physical Exam Vitals reviewed.  Constitutional:      Appearance: He is well-developed.  HENT:     Head: Normocephalic and atraumatic.     Right Ear: Tympanic membrane, ear canal and external ear normal. No drainage, swelling or tenderness. Tympanic membrane is not injected, scarred, erythematous, retracted or bulging.     Left Ear: Tympanic membrane, ear canal and external ear normal. No drainage, swelling or tenderness. Tympanic membrane is not injected, scarred, erythematous, retracted or bulging.     Nose: Mucosal edema and rhinorrhea present. No nasal deformity or septal deviation.     Right Turbinates: Enlarged, swollen and pale.     Left Turbinates: Enlarged, swollen and pale.     Right Sinus: No maxillary sinus tenderness or frontal sinus tenderness.     Left Sinus: No maxillary sinus tenderness or frontal sinus tenderness.     Mouth/Throat:     Mouth: Mucous membranes are not pale and not dry.     Pharynx: Uvula midline.  Eyes:     General:        Right eye: No discharge.        Left eye: No discharge.     Conjunctiva/sclera: Conjunctivae normal.     Right eye: Right conjunctiva is not injected. No chemosis.    Left eye: Left conjunctiva is not injected. No chemosis.    Pupils: Pupils are equal, round, and reactive to light.  Cardiovascular:     Rate and Rhythm: Normal rate and regular  rhythm.     Heart sounds: Normal heart sounds.  Pulmonary:     Effort: Pulmonary effort is normal. No tachypnea, accessory muscle usage or respiratory distress.     Breath sounds: Normal breath sounds. No wheezing, rhonchi or rales.  Chest:     Chest wall: No tenderness.  Abdominal:     Tenderness: There is no abdominal tenderness. There is no guarding or rebound.  Lymphadenopathy:     Head:     Right side of head: No submandibular, tonsillar or occipital adenopathy.     Left side of head: No submandibular, tonsillar or occipital adenopathy.     Cervical: No cervical adenopathy.  Skin:    Coloration: Skin is not pale.     Findings: No abrasion, erythema, petechiae or rash. Rash is not papular, urticarial or vesicular.  Neurological:     Mental Status: He is alert.      Diagnostic studies:    Spirometry: results abnormal (FEV1: 2.44/69%, FVC: 2.86/62%, FEV1/FVC: 85%).    Spirometry consistent with possible restrictive disease. Xopenex four puffs via MDI treatment given in clinic with no improvement. The peak expiratory flow did increase 13%, however. Otherwise there was no change at all.   Allergy Studies:     Airborne Adult Perc - 12/06/21 1524     Time Antigen Placed 1524    Allergen Manufacturer Lavella Hammock    Location Back    Number of Test 59    Panel 1 Select    1. Control-Buffer 50% Glycerol Negative    2. Control-Histamine 1 mg/ml  2+    3. Albumin saline Negative    4. Cinnamon Lake Negative    5. [place] Negative    6. Johnson Negative    7. Montier Blue Negative    8. Meadow Fescue Negative    9. Perennial Rye Negative    10. Sweet Vernal Negative    11. Timothy Negative    12. Cocklebur Negative    13. Burweed Marshelder Negative    14. Ragweed, short Negative    15. Ragweed, Giant Negative    16. Plantain,  English Negative    17. Lamb's Quarters Negative    18. Sheep Sorrell Negative    19. Rough Pigweed Negative    20. Marsh Elder, Rough Negative    21.  Mugwort, Common Negative    22. Ash mix Negative    23. Birch mix Negative    24. Beech American Negative    25. Box, Elder Negative    26. Cedar, red Negative    27. Cottonwood, [place] Negative    28. Elm mix Negative    29. Hickory Negative    30. Maple mix Negative    31. Oak, [place] mix Negative    32. Pecan Pollen Negative    33. Pine mix Negative    34. Sycamore Eastern Negative    35. Monona, Black Pollen Negative    36. Alternaria alternata 2+    37. Cladosporium Herbarum Negative    38. Aspergillus mix Negative    39. Penicillium mix Negative    40. Bipolaris sorokiniana (Helminthosporium) Negative    41. Drechslera spicifera (Curvularia) Negative    42. Mucor plumbeus Negative    43. Fusarium moniliforme Negative    44. Aureobasidium pullulans (pullulara) Negative    45. Rhizopus oryzae Negative    46. Botrytis cinera Negative    47. Epicoccum nigrum Negative    48. Phoma betae Negative    49. Candida Albicans Negative    50. Trichophyton mentagrophytes Negative    51. Mite, D Farinae  5,000 AU/ml Negative    52. Mite, D Pteronyssinus  5,000 AU/ml Negative    53. Cat Hair 10,000 BAU/ml Negative    54.  Dog Epithelia Negative    55. Mixed Feathers Negative    56. Horse Epithelia Negative    57. Cockroach, German Negative    58. Mouse Negative    59. Tobacco Leaf Negative             Intradermal - 12/06/21 1557     Time Antigen Placed --    Allergen Manufacturer --    Location --    Number of Test --    Control Negative    [place] 2+    Johnson 1+    7 Grass Negative    Ragweed mix 2+    Weed mix 2+    Tree mix Negative    Mold 2 2+    Mold 3 3+    Mold 4 Negative    Cat 3+    Dog Negative    Cockroach Negative    Mite mix 1+             Allergy testing results were read and interpreted by myself, documented by clinical staff.         Salvatore Marvel, MD [facility]

## 2021-12-06 NOTE — Patient Instructions (Addendum)
1. SOB (shortness of breath) - Lung testing was in the high 60% range, but there was no improvement with albuterol treatment.  - I am not convinced that this is asthma either, but I would continue with the Spiriva two puffs once daily as directed by your PCP to see if this helps.  - I am going to start with a chest X-ray and make changes from there.  2. Chronic rhinitis - with itching - Testing today showed: grasses, ragweed, weeds, indoor molds, outdoor molds, dust mites, and cat. - Copy of test results provided.  - Avoidance measures provided. - Continue with: Zyrtec (cetirizine) '10mg'$  tablet BUT INCREASE to one tablet twice daly - Start taking: Flonase (fluticasone) one spray per nostril daily (AIM FOR EAR ON EACH SIDE) - You can use an extra dose of the antihistamine, if needed, for breakthrough symptoms.  - Consider nasal saline rinses 1-2 times daily to remove allergens from the nasal cavities as well as help with mucous clearance (this is especially helpful to do before the nasal sprays are given) - We are going to start allergy shots as a means of long-term control. - Allergy shots "re-train" and "reset" the immune system to ignore environmental allergens and decrease the resulting immune response to those allergens (sneezing, itchy watery eyes, runny nose, nasal congestion, etc).    - Allergy shots improve symptoms in 75-85% of patients.  - Make an appointment to start allergy shots in 2-3 weeks.  3. Pruritis (itching) - Start triamcinolone cream twice daily as needed to the itchy areas. - Increasing the cetirizine should help as well.  4. Return in about 2 months (around 02/05/2022).    Please inform us of any Emergency Department visits, hospitalizations, or changes in symptoms. Call us before going to the ED for breathing or allergy symptoms since we might be able to fit you in for a sick visit. Feel free to contact us anytime with any questions, problems, or concerns.  It was a  pleasure to meet you today!  Websites that have reliable patient information: 1. American Academy of Asthma, Allergy, and Immunology: www.aaaai.org 2. Food Allergy Research and Education (FARE): foodallergy.org 3. Mothers of Asthmatics: http://www.asthmacommunitynetwork.org 4. American College of Allergy, Asthma, and Immunology: www.acaai.org   COVID-19 Vaccine Information can be found at: [email or web address] For questions related to vaccine distribution or appointments, please email [email or web address] or call [phone].   We realize that you might be concerned about having an allergic reaction to the COVID19 vaccines. To help with that concern, WE ARE OFFERING THE COVID19 VACCINES IN OUR OFFICE! Ask the front desk for dates!     "Like" [place] on Facebook and Instagram for our latest updates!      A healthy democracy works best when [employer] participate! Make sure you are registered to vote! If you have moved or changed any of your contact information, you will need to get this updated before voting!  In some cases, you MAY be able to register to vote online: [email or web address]       Airborne Adult Perc - 12/06/21 1524     Time Antigen Placed [place] Lavella Hammock    Location Back    Number of Test 59    Panel 1 Select    1. Control-Buffer 50% Glycerol Negative    2. Control-Histamine 1 mg/ml 2+    3. Albumin saline Negative    4. [place] Negative    5. [place] Negative    6.  Johnson Negative    7. Edmonson Blue Negative    8. Meadow Fescue Negative    9. Perennial Rye Negative    10. Sweet Vernal Negative    11. Timothy Negative    12. Cocklebur Negative    13. Burweed Marshelder Negative    14. Ragweed, short Negative    15. Ragweed, Giant Negative    16. Plantain,  English Negative    17. Lamb's Quarters Negative    18. Sheep Sorrell Negative    19. Rough  Pigweed Negative    20. Marsh Elder, Rough Negative    21. Mugwort, Common Negative    22. Ash mix Negative    23. Birch mix Negative    24. Beech American Negative    25. Box, Elder Negative    26. Cedar, red Negative    27. Cottonwood, [place] Negative    28. Elm mix Negative    29. Hickory Negative    30. Maple mix Negative    31. Oak, [place] mix Negative    32. Pecan Pollen Negative    33. Pine mix Negative    34. Sycamore Eastern Negative    35. Glade, Black Pollen Negative    36. Alternaria alternata 2+    37. Cladosporium Herbarum Negative    38. Aspergillus mix Negative    39. Penicillium mix Negative    40. Bipolaris sorokiniana (Helminthosporium) Negative    41. Drechslera spicifera (Curvularia) Negative    42. Mucor plumbeus Negative    43. Fusarium moniliforme Negative    44. Aureobasidium pullulans (pullulara) Negative    45. Rhizopus oryzae Negative    46. Botrytis cinera Negative    47. Epicoccum nigrum Negative    48. Phoma betae Negative    49. Candida Albicans Negative    50. Trichophyton mentagrophytes Negative    51. Mite, D Farinae  5,000 AU/ml Negative    52. Mite, D Pteronyssinus  5,000 AU/ml Negative    53. Cat Hair 10,000 BAU/ml Negative    54.  Dog Epithelia Negative    55. Mixed Feathers Negative    56. Horse Epithelia Negative    57. Cockroach, German Negative    58. Mouse Negative    59. Tobacco Leaf Negative             Intradermal - 12/06/21 1557     Time Antigen Placed --    Allergen Manufacturer --    Location --    Number of Test --    Control Negative    [place] 2+    Johnson 1+    7 Grass Negative    Ragweed mix 2+    Weed mix 2+    Tree mix Negative    Mold 2 2+    Mold 3 3+    Mold 4 Negative    Cat 3+    Dog Negative    Cockroach Negative    Mite mix 1+            Reducing Pollen Exposure  The American Academy of Allergy, Asthma and Immunology suggests the following steps to reduce your exposure to  pollen during allergy seasons.    Do not hang sheets or clothing out to dry; pollen may collect on these items. Do not mow lawns or spend time around freshly cut grass; mowing stirs up pollen. Keep windows closed at night.  Keep car windows closed while driving. Minimize morning activities outdoors, a time when pollen counts are  usually at their highest. Stay indoors as much as possible when pollen counts or humidity is high and on windy days when pollen tends to remain in the air longer. Use air conditioning when possible.  Many air conditioners have filters that trap the pollen spores. Use a HEPA room air filter to remove pollen form the indoor air you breathe.  Control of Mold Allergen   Mold and fungi can grow on a variety of surfaces provided certain temperature and moisture conditions exist.  Outdoor molds grow on plants, decaying vegetation and soil.  The major outdoor mold, Alternaria and Cladosporium, are found in very high numbers during hot and dry conditions.  Generally, a late Summer - Fall peak is seen for common outdoor fungal spores.  Rain will temporarily lower outdoor mold spore count, but counts rise rapidly when the rainy period ends.  The most important indoor molds are Aspergillus and Penicillium.  Dark, humid and poorly ventilated basements are ideal sites for mold growth.  The next most common sites of mold growth are the bathroom and the kitchen.  Outdoor (Seasonal) Mold Control  Positive outdoor molds via skin testing: Alternaria, Cladosporium, Bipolaris (Helminthsporium), and Drechslera (Curvalaria)  Use air conditioning and keep windows closed Avoid exposure to decaying vegetation. Avoid leaf raking. Avoid grain handling. Consider wearing a face mask if working in moldy areas.    Indoor (Perennial) Mold Control   Positive indoor molds via skin testing: Aspergillus and Penicillium  Maintain humidity below 50%. Clean washable surfaces with 5% bleach  solution. Remove sources e.g. contaminated carpets.    Control of Dust Mite Allergen    Dust mites play a major role in allergic asthma and rhinitis.  They occur in environments with high humidity wherever human skin is found.  Dust mites absorb humidity from the atmosphere (ie, they do not drink) and feed on organic matter (including shed human and animal skin).  Dust mites are a microscopic type of insect that you cannot see with the naked eye.  High levels of dust mites have been detected from mattresses, pillows, carpets, upholstered furniture, bed covers, clothes, soft toys and any woven material.  The principal allergen of the dust mite is found in its feces.  A gram of dust may contain 1,000 mites and 250,000 fecal particles.  Mite antigen is easily measured in the air during house cleaning activities.  Dust mites do not bite and do not cause harm to humans, other than by triggering allergies/asthma.    Ways to decrease your exposure to dust mites in your home:  Encase mattresses, box springs and pillows with a mite-impermeable barrier or cover   Wash sheets, blankets and drapes weekly in hot water (130 F) with detergent and dry them in a dryer on the hot setting.  Have the room cleaned frequently with a vacuum cleaner and a damp dust-mop.  For carpeting or rugs, vacuuming with a vacuum cleaner equipped with a high-efficiency particulate air (HEPA) filter.  The dust mite allergic individual should not be in a room which is being cleaned and should wait 1 hour after cleaning before going into the room. Do not sleep on upholstered furniture (eg, couches).   If possible removing carpeting, upholstered furniture and drapery from the home is ideal.  Horizontal blinds should be eliminated in the rooms where the person spends the most time (bedroom, study, television room).  Washable vinyl, roller-type shades are optimal. Remove all non-washable stuffed toys from the bedroom.  Wash stuffed toys  weekly like sheets and blankets above.   Reduce indoor humidity to less than 50%.  Inexpensive humidity monitors can be purchased at most hardware stores.  Do not use a humidifier as can make the problem worse and are not recommended.  Control of Dog or Cat Allergen  Avoidance is the best way to manage a dog or cat allergy. If you have a dog or cat and are allergic to dog or cats, consider removing the dog or cat from the home. If you have a dog or cat but don't want to find it a new home, or if your family wants a pet even though someone in the household is allergic, here are some strategies that may help keep symptoms at bay:  Keep the pet out of your bedroom and restrict it to only a few rooms. Be advised that keeping the dog or cat in only one room will not limit the allergens to that room. Don't pet, hug or kiss the dog or cat; if you do, wash your hands with soap and water. High-efficiency particulate air (HEPA) cleaners run continuously in a bedroom or living room can reduce allergen levels over time. Regular use of a high-efficiency vacuum cleaner or a central vacuum can reduce allergen levels. Giving your dog or cat a bath at least once a week can reduce airborne allergen.

## 2021-12-07 ENCOUNTER — Encounter: Payer: Self-pay | Admitting: Allergy & Immunology

## 2021-12-07 DIAGNOSIS — J302 Other seasonal allergic rhinitis: Secondary | ICD-10-CM | POA: Insufficient documentation

## 2021-12-07 DIAGNOSIS — R0602 Shortness of breath: Secondary | ICD-10-CM | POA: Insufficient documentation

## 2021-12-08 ENCOUNTER — Ambulatory Visit (HOSPITAL_COMMUNITY)
Admission: RE | Admit: 2021-12-08 | Discharge: 2021-12-08 | Disposition: A | Discharge status: Home / Self Care | Payer: PPO | Source: Ambulatory Visit | Attending: Allergy & Immunology | Admitting: Allergy & Immunology

## 2021-12-08 DIAGNOSIS — R0602 Shortness of breath: Secondary | ICD-10-CM | POA: Insufficient documentation

## 2021-12-11 DIAGNOSIS — J3081 Allergic rhinitis due to animal (cat) (dog) hair and dander: Secondary | ICD-10-CM | POA: Diagnosis not present

## 2021-12-11 NOTE — Progress Notes (Signed)
VIALS EXP 12-12-22

## 2021-12-12 DIAGNOSIS — J3089 Other allergic rhinitis: Secondary | ICD-10-CM

## 2021-12-21 IMAGING — DX DG CHEST 1V PORT
1 series · 1 of 1 positions shown · non-contrast
Comparison: 10/26/2014

CLINICAL DATA: Short of breath, cough, mop CIS, NZCZQ-MV positive

EXAM:
PORTABLE CHEST 1 VIEW

[chest ap grid]
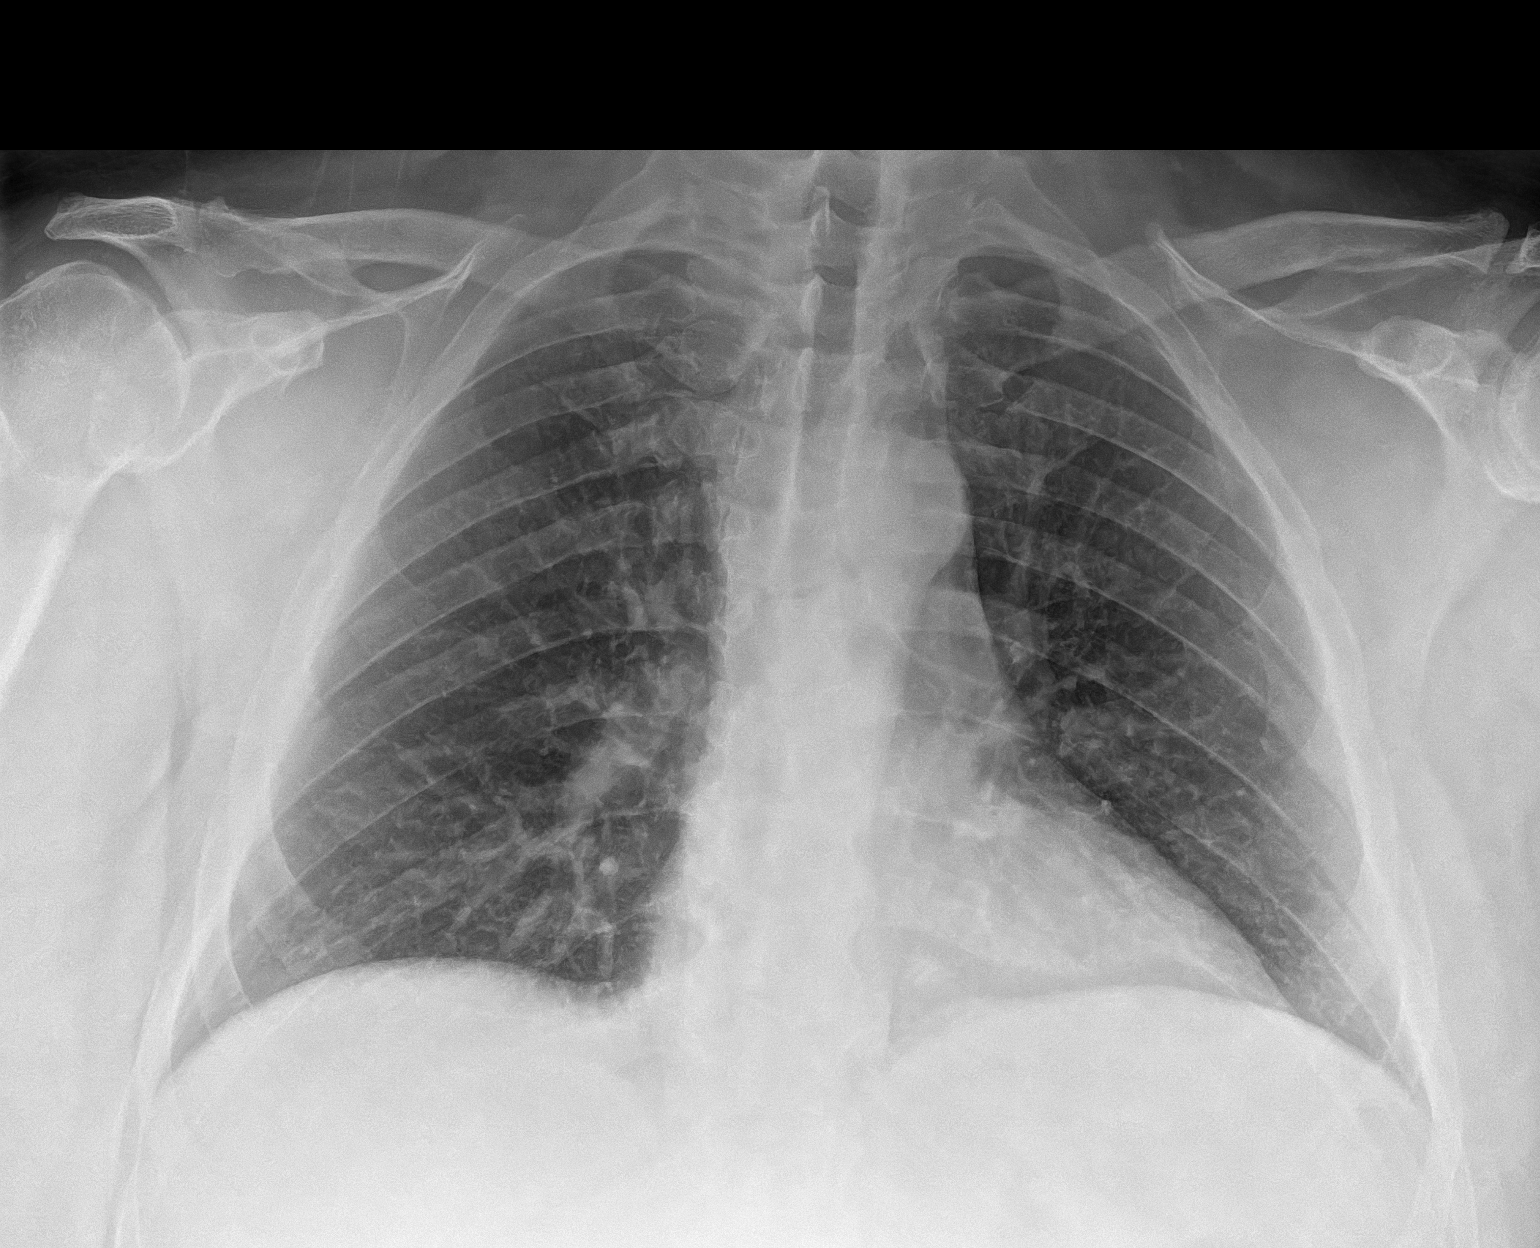

[1 of 1 positions shown; findings below may reference images not displayed]

FINDINGS: The heart size and mediastinal contours are within normal limits.
Both lungs are clear. The visualized skeletal structures are
unremarkable.
IMPRESSION: No active disease.

## 2021-12-21 IMAGING — CT CT ANGIO CHEST
2 of 6 series · 18 of 46 positions shown · IV contrast (omnipaque)
Comparison: Radiograph 05/16/2020

CLINICAL DATA: Shortness of breath, possible ICZMB-EL, presenting
from [REDACTED] following hemoptysis

EXAM:
CT ANGIOGRAPHY CHEST WITH CONTRAST
TECHNIQUE: Multidetector CT imaging of the chest was performed using the
standard protocol during bolus administration of intravenous
contrast. Multiplanar CT image reconstructions and MIPs were
obtained to evaluate the vascular anatomy.
CONTRAST:  100mL OMNIPAQUE IOHEXOL 350 MG/ML SOLN

[Series 5: pe axial thins · axial · 0.81mm/px · z∈[+1110,+1404]mm · 15 of 324 slices shown]
[im 15/324  lung]
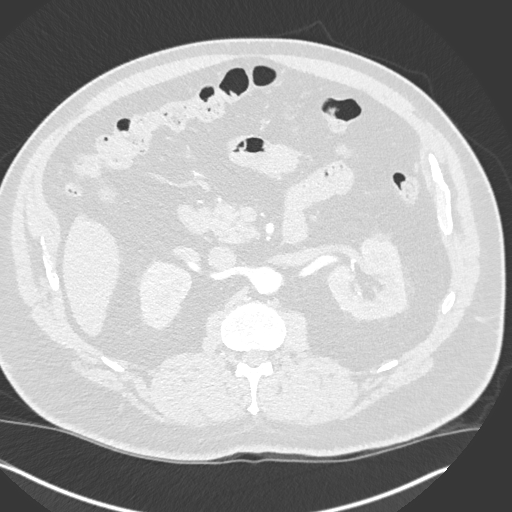
[im 43/324  soft-tissue]
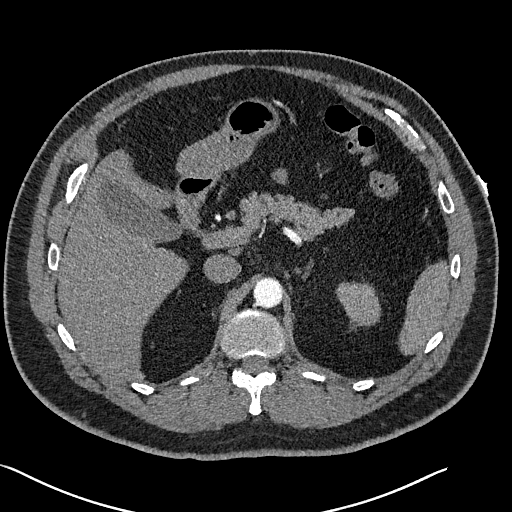
[im 57/324  lung]
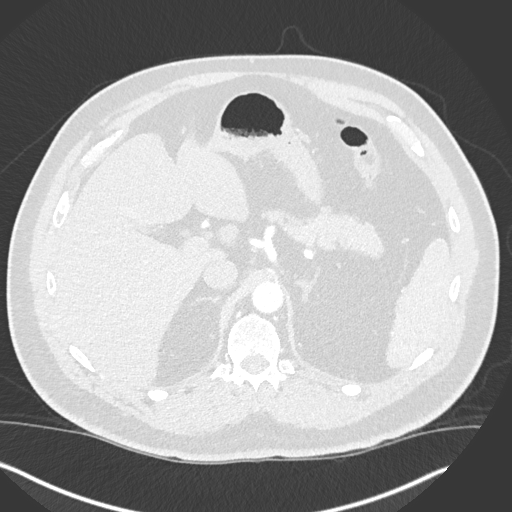
[im 85/324  soft-tissue]
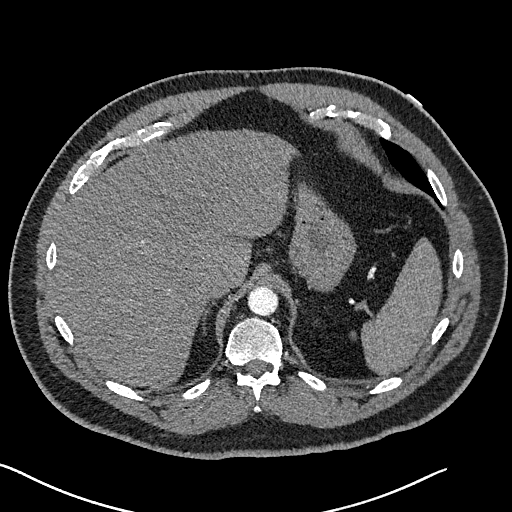
[im 99/324  lung]
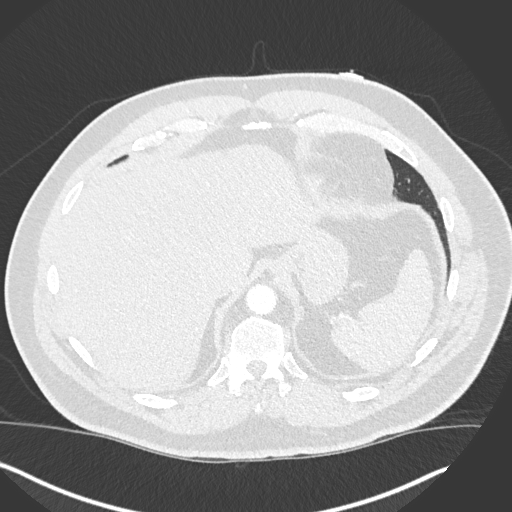
[im 127/324  soft-tissue]
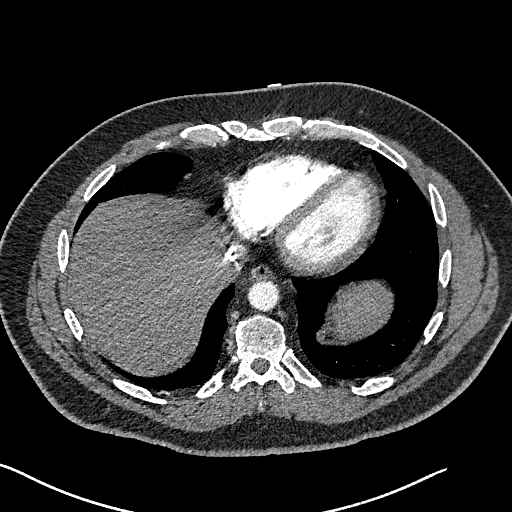
[im 141/324  lung]
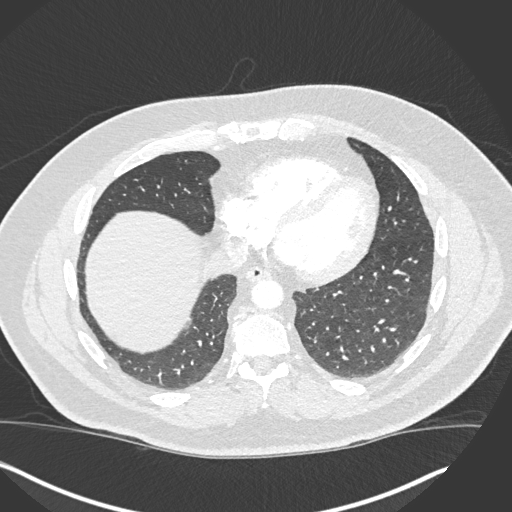
[im 169/324  soft-tissue]
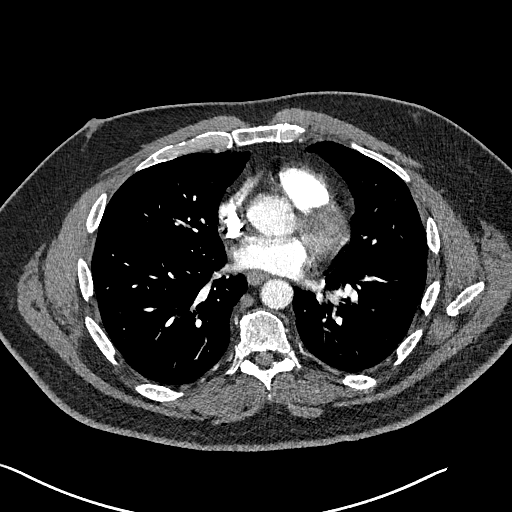
[im 183/324  lung]
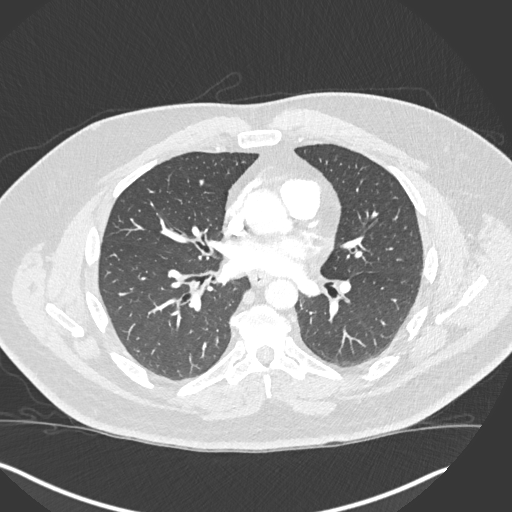
[im 197/324  soft-tissue]
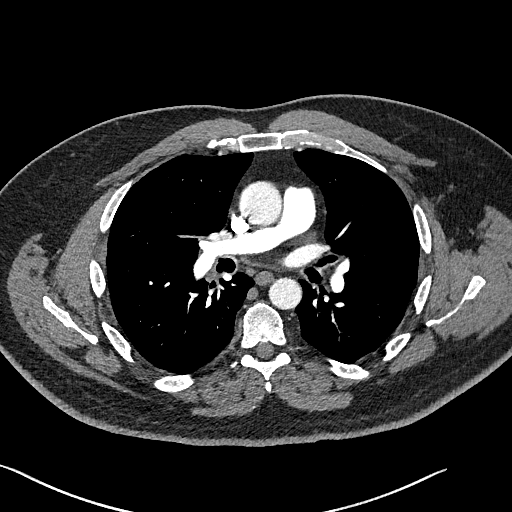
[im 225/324  lung]
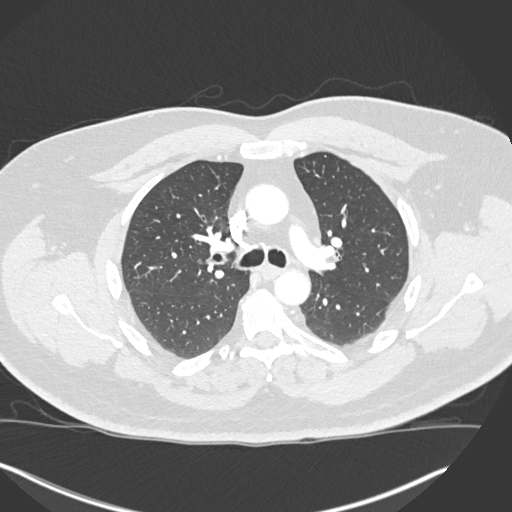
[im 239/324  soft-tissue]
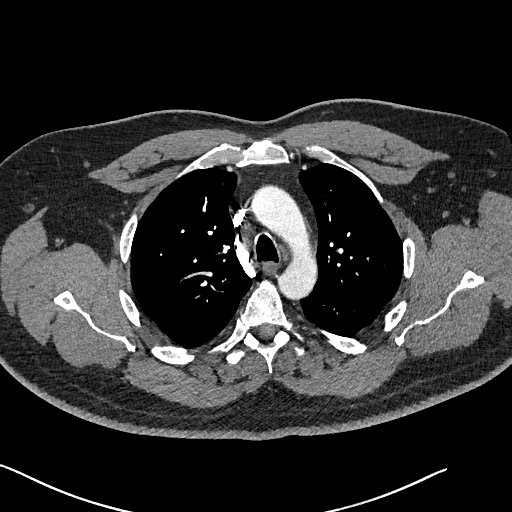
[im 267/324  lung]
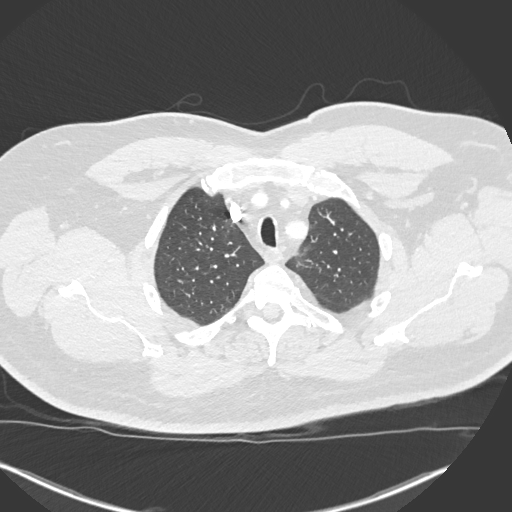
[im 281/324  soft-tissue]
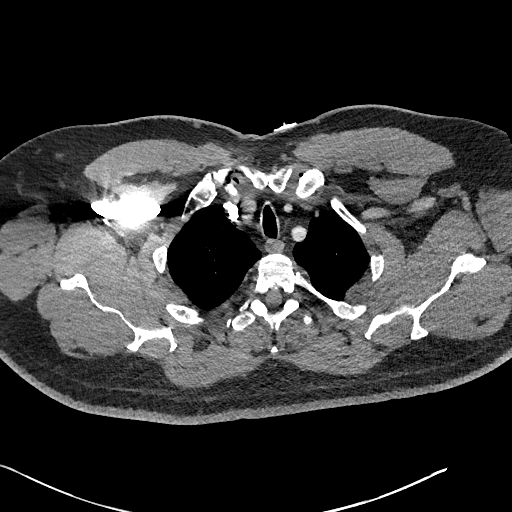
[im 309/324  lung]
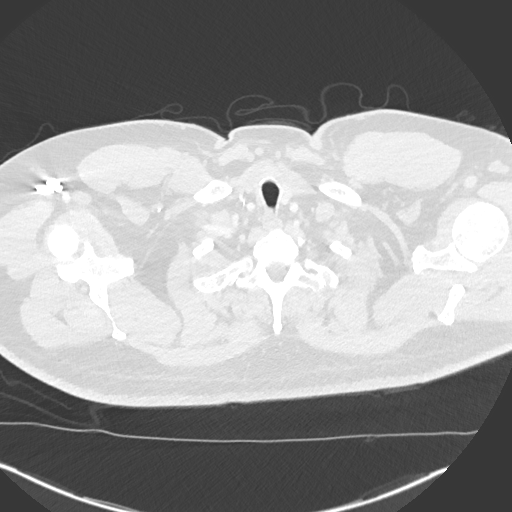

[Series 7: cor soft · coronal · 0.73mm/px · 3 of 174 slices shown]
[im 44/174  soft-tissue]
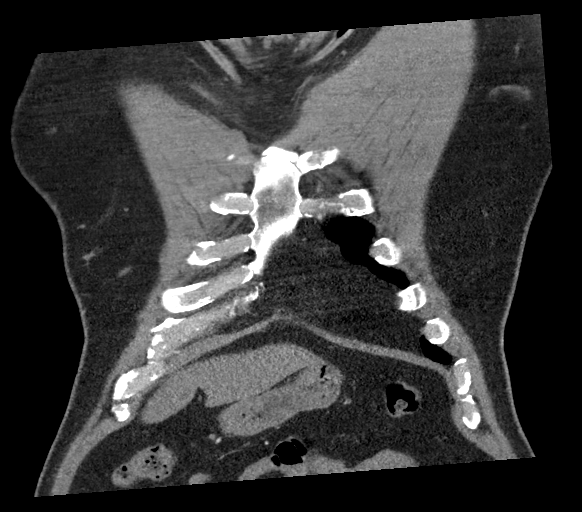
[im 87/174  soft-tissue]
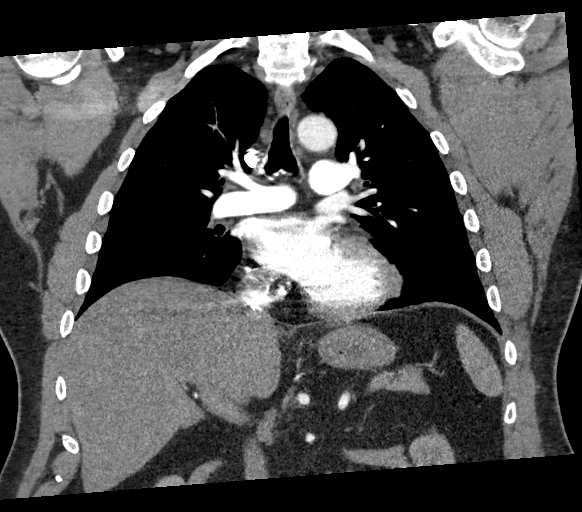
[im 130/174  soft-tissue]
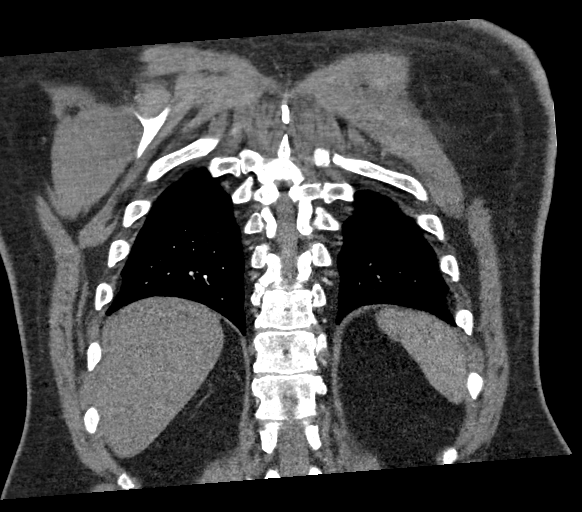

[18 of 46 positions shown; findings below may reference images not displayed]

FINDINGS: Cardiovascular: Satisfactory opacification the pulmonary arteries to
the segmental level. No pulmonary artery filling defects are
identified. Central pulmonary arteries are normal caliber. Normal
heart size. No pericardial effusion. The aorta is normal caliber.
Normal 3 vessel branching of the aortic arch. Proximal great vessels
are unremarkable. Upper abdominal branches are unremarkable aside
from a diminutive accessory left upper pole renal artery.

Mediastinum/Nodes: No mediastinal fluid or gas. Normal thyroid gland
and thoracic inlet. No acute abnormality of the trachea or
esophagus. No worrisome mediastinal, hilar or axillary adenopathy.

Lungs/Pleura: Mild airways thickening is seen diffusely. No
consolidation, features of edema, pneumothorax, or effusion. No
suspicious pulmonary nodules or masses.

Upper Abdomen: 1.7 cm fluid attenuation cyst seen in the anterior
left lobe liver. No concerning or worrisome features. Diffuse
hepatic hypoattenuation compatible with hepatic steatosis. Sparing
along the gallbladder fossa. Mild bilateral perinephric stranding,
nonspecific.

Musculoskeletal: No acute osseous abnormality or suspicious osseous
lesion. Multilevel degenerative changes are present in the imaged
portions of the spine. Mild degenerative changes in the bilateral
shoulders. No worrisome chest wall masses. Mild bilateral
gynecomastia.

Review of the MIP images confirms the above findings.
IMPRESSION: 1. No evidence of pulmonary embolism.
2. Mild airways thickening, could reflect acute or chronic
bronchitis versus reactive airways disease.
3. No other acute intrathoracic process.
4. Hepatic steatosis.
5. Mild bilateral perinephric stranding, nonspecific and possibly
related to age or diminished renal function though can be seen in
the setting of urinary tract infection. Correlate for urinary
symptoms and consider urinalysis as clinically warranted.

## 2021-12-26 ENCOUNTER — Encounter: Payer: Self-pay | Admitting: Gastroenterology

## 2021-12-27 ENCOUNTER — Ambulatory Visit (INDEPENDENT_AMBULATORY_CARE_PROVIDER_SITE_OTHER): Payer: No Typology Code available for payment source

## 2021-12-27 DIAGNOSIS — J309 Allergic rhinitis, unspecified: Secondary | ICD-10-CM | POA: Diagnosis not present

## 2021-12-27 NOTE — Progress Notes (Signed)
Immunotherapy   Patient Details  Name: Gurtaj Ruz MRN: [id number] Date of Birth: [birth year]  12/27/2021  Katrinka Blazing started injections for  grass, weeds, ragweed's, cat, molds, and dust mites.  Following schedule: A  Frequency:1 time per week Epi-Pen:Epi-Pen Available  Consent signed and patient instructions given. Patient waite din the lobby for thirty minutes without an issue. Patient was taught how to use his Epipen in case he needed to use it in the future. Patient also was given the protocol as well as injection room hours.    Julius Bowels 12/27/2021, 11:54 AM

## 2022-01-10 ENCOUNTER — Ambulatory Visit: Payer: PPO | Admitting: Gastroenterology

## 2022-01-23 ENCOUNTER — Ambulatory Visit: Payer: PPO | Admitting: Gastroenterology

## 2022-01-23 NOTE — Progress Notes (Unsigned)
Referring Provider: Hyler, Gwen Her, NP Primary Care Physician:  Beverly Milch, NP Primary Gastroenterologist:  Dr. Abbey Chatters  Chief Complaint  Patient presents with   Hernia    In the middle of chest going down to the stomach. Hurts sometimes.     HPI:   Alexander Gutierrez is a 61 y.o. male presenting today at the request of Hyler, Gwen Her, NP for cirrhosis and abdominal bloating.   Seen in the emergency room on 11/29/2021 with concerns for abdominal bloating and swelling.  Reported he had been on vacation and had not taken Lasix in 2 weeks.  Ultrasound did not reveal any visible ascites.  CT A/P with hepatic steatosis, normal spleen, no acute abnormality.  CBC, CMP, lipase all within normal limits aside from glucose of 109.   Today:  Reports he has a hernia in the mid abdomen. Notices pain when he does certain activities, specifically when laying face down on a mat, raising his torso up to stretch his lower back, otherwise, it doesn't bother him.   Occasional nausea but no vomiting. Sometimes after medications or eating. Sometimes random. Nausea started over the last few months. No heartburn. Used to have heartburn and took omeprazole, but hasn't had any issue with this is quite some time, no longer taking omeprazole.  Admits to intermittent solid food and pill dysphagia about once a month.  No regurgitation.    Reports he has been having rectal bleeding recently.  He has had some intermittent rectal bleeding in the past, but it was light and infrequent. Rectal bleeding seems to be increasing in volume and frequency.  Bright red blood in the stool.  No known history of hemorrhoids.  He also reports intermittent black stools though this is rare. Seems to be if he has occasional constipation.  Denies Pepto-Bismol.  Not sure if iron is in his daily vitamin.  In general, bowels move well daily to twice daily.  He did have a recent episode of constipation, but this resolved.  He did note some rectal  soreness when he had constipation, but this resolved.  Reports 2 colonoscopies in the past at the [place] in [name]. Had polyps. First colonoscopy was in 2015, last was in 2019 or 2020. Back then had rare rectal bleeding.   Rare use of ibuprofen for neck pain.   No unintentional weight loss. He has been intentionally losing weight through fasting.   Reports he had a liver biopsy in March at the [facility] and was told he has stage 4 liver scarring/cirrhosis.  Also had an MRI of his liver and they plan to do an MRI every 6 months.  States he has a couple of cysts that they think are benign.  States they do not know what causes cirrhosis, but thinks it may have been secondary to chemicals he was exposed to while in the [employer].  Denies any history of alcohol or drug use.  He is not sure what other workup has been done.  Reports he is following with the liver clinic at the [facility], but he is transitioning his care to  [facility] and has his first appointment on August 22.  This is his very first appointment with a primary provider and is not sure what services they offer.   Reports chronic issues with swelling in his left food. Has gout in left foot. Takes lasix 20 mg daily.  No abdominal diatension, mental status changes, easy bruising.  No prior EGD.  No prior EGD.  EGD/TCS Labs 2000 mg diet Request liver biopsy and tcs records.  Get labs, hepatitis testing, etc.   Past Medical History:  Diagnosis Date   ADHD    Chronic back pain    Chronic fatigue    Cirrhosis (HCC)    Depression    Erectile dysfunction    GERD (gastroesophageal reflux disease)    Gout    Hypercholesteremia    OSA (obstructive sleep apnea)    PTSD (post-traumatic stress disorder)    Vocal cord dysfunction     Past Surgical History:  Procedure Laterality Date   COLONOSCOPY     LARYNGOSCOPY      Current Outpatient Medications  Medication Sig Dispense Refill   albuterol (PROVENTIL) (2.5 MG/3ML) 0.083% nebulizer  solution Take 3 mLs (2.5 mg total) by nebulization every 6 (six) hours as needed for wheezing or shortness of breath. 75 mL 2   allopurinol (ZYLOPRIM) 100 MG tablet Take 100 mg by mouth daily.     atorvastatin (LIPITOR) 20 MG tablet Take 20 mg by mouth daily.     Azelastine HCl (ASTEPRO) 0.15 % SOLN Place 1 spray into the nose in the morning.     cetirizine (ZYRTEC) 10 MG tablet Take 1 tablet (10 mg total) by mouth 2 (two) times daily. 60 tablet 5   diazepam (VALIUM) 5 MG tablet Take 1 tablet (5 mg total) by mouth every 6 (six) hours as needed for anxiety (spasms). 10 tablet 0   EPINEPHrine 0.3 mg/0.3 mL IJ SOAJ injection Inject 0.3 mg into the muscle as needed for anaphylaxis. 1 each 1   fluticasone (FLONASE) 50 MCG/ACT nasal spray Place 1 spray into both nostrils every evening. 16 g 2   furosemide (LASIX) 20 MG tablet Take 20 mg by mouth 2 (two) times daily.     hydrocortisone (ANUSOL-HC) 2.5 % rectal cream Place 1 Application rectally 2 (two) times daily. 30 g 1   propranolol (INDERAL) 10 MG tablet Take 10 mg by mouth daily.     Sertraline HCl (ZOLOFT PO) Take 1 tablet by mouth daily.     tadalafil (CIALIS) 5 MG tablet Take 5 mg by mouth daily as needed for erectile dysfunction.     triamcinolone cream (KENALOG) 0.1 % Apply 1 Application topically 2 (two) times daily. 454 g 1   polyethylene glycol-electrolytes (NULYTELY) 420 g solution Take 4,000 mLs by mouth once for 1 dose. 4000 mL 0   No current facility-administered medications for this visit.    Allergies as of 01/24/2022   (No Known Allergies)    History reviewed. No pertinent family history.  Social History   Socioeconomic History   Marital status: Married    Spouse name: Not on file   Number of children: Not on file   Years of education: Not on file   Highest education level: Not on file  Occupational History   Not on file  Tobacco Use   Smoking status: Never   Smokeless tobacco: Never  Substance and Sexual Activity    Alcohol use: Never   Drug use: No   Sexual activity: Not on file  Other Topics Concern   Not on file  Social History Narrative   Not on file   Social Determinants of Health   Financial Resource Strain: Not on file  Food Insecurity: Not on file  Transportation Needs: Not on file  Physical Activity: Not on file  Stress: Not on file  Social Connections: Not on file  Intimate Partner Violence: Not  on file    Review of Systems: Gen: Denies any fever, chills, cold or flu like symptoms, pre-syncope, or syncope.   CV: Denies chest pain, heart palpitations. Resp: Denies shortness of breath, cough.  GI: See HPI GU : Denies urinary burning, urinary frequency, urinary hesitancy MS: Denies joint pain. Derm: Denies rash. Psych: Denies depression, anxiety. Heme: See HPI  Physical Exam: BP 136/85 (BP Location: Right Arm, Patient Position: Sitting, Cuff Size: Large)   Pulse 73   Temp 97.9 F (36.6 C) (Temporal)   Ht '5\' 10"'$  (1.778 m)   Wt 283 lb 3.2 oz (128.5 kg)   BMI 40.63 kg/m  General:   Alert and oriented. Pleasant and cooperative. Well-nourished and well-developed.  Head:  Normocephalic and atraumatic. Eyes:  Without icterus, sclera clear and conjunctiva pink.  Ears:  Normal auditory acuity. Lungs:  Clear to auscultation bilaterally. No wheezes, rales, or rhonchi. No distress.  Heart:  S1, S2 present without murmurs appreciated.  Abdomen:  +BS, soft, non-tender and non-distended. No HSM noted. No guarding or rebound. No masses appreciated. + Rectus diastases. Rectal:  Deferred to time of colonoscopy. Msk:  Symmetrical without gross deformities. Normal posture. Extremities:  Without edema. Neurologic:  Alert and  oriented x4;  grossly normal neurologically. Skin:  Intact without significant lesions or rashes. Psych:  Normal mood and affect.    Assessment:  61 year old male with history of asthma, PTSD, gout, HLD, OSA, reported history of cirrhosis, presenting today with  chief complaint of abdominal hernia.  Also reported nausea, dysphagia, rectal bleeding, black stools, and recently diagnosed cirrhosis.  Rectus diastases: Patient initially concerned about hernia in the upper mid abdomen.  On exam, he has evidence of rectus diastases.  Recent CT A/P 11/29/2021 with no evidence of hernia.  He was counseled on this today.  In general, he is asymptomatic from this unless doing a specific exercise to stretch his lower back with lying on his abdomen and raising his torso towards the ceiling.  He will plan to avoid this stretch.   Nausea without vomiting:  Couple month history of intermittent nausea without vomiting occasionally related to medications, postprandially, or at random.  Denies heartburn.  No routine NSAID use.  Recent CT in June with no significant abnormalities to explain nausea.  Labs in June with CBC, CMP, lipase all within normal limits aside from slightly elevated glucose of 109.  Symptoms are mild and nonspecific.  Differentials include med effect, gastritis, duodenitis, H. pylori, PUD. We are planning on EGD to evaluate dysphagia and ? Black stools discussed below. This will also help evaluate his nausea.   Dysphagia: Intermittent solid food and pill dysphagia. No regurgitation.  Reports history of heartburn, but has not had any trouble with this in quite some time, not on a PPI.  May have esophageal web, ring, stricture, unable to rule out malignancy.  We will arrange an EGD in the near future for further evaluation and therapeutic intervention.  Black stools: Intermittent, fairly infrequent.  Seems to be most closely associated with occasional constipation.  Unclear if this is true melena though based on description, I feel it is less likely.  May be related to diet or intermittent constipation.  Patient also states that he is not sure if iron is in his multivitamin.  Denies routine NSAID use. No pepto bismol. Recent CT with no significant abnormalities.  Hemoglobin wnl in June.  We are planning on EGD in the near future for dysphagia which will also help evaluate  reported black stools.  Rectal bleeding: History of intermittent rectal bleeding that used to be fairly infrequent and light, but bleeding seems to be increasing in frequency and severity with bright red blood in his stool.  Admits to occasional constipation, but nothing routine.  No known history of hemorrhoids.  He does note he had a little rectal soreness with his recent episode of constipation, but this has resolved. No unintentional weight loss.  Recent CT in June with no significant abnormalities, evidence of hepatic steatosis.  Hemoglobin within normal limits.  He reports history of colon polyps with his last colonoscopy in around 2019 or 2020.  Differentials include hemorrhoids, colon polyps, and can't rule out malignancy.  Recommend updating colonoscopy. Also provided anusol rectal cream for possible hemorrhoids.   Cirrhosis: Patient reports he recently had a liver biopsy at [facility] and was told he has grade 4 scarring/cirrhosis of the liver.  States they suspect cirrhosis may be secondary to chemical exposures while in the [employer].  He is not sure what additional serologic workup has been completed.  Denies any history of alcohol use.  He does report they are performing serial MRIs every 6 months due to cyst on his liver that they suspect are benign.  Most recent imaging on file in our system is a CT A/P with contrast on 11/29/2021 that showed hepatic steatosis only, normal spleen.  Labs in June with LFTs within normal limits.  No INR on file.  No prior EGD. He is on propranolol daily. Clinically, he remains well compensated, currently on Lasix 20 mg daily. He is experiencing some GI bleeding discussed above though I do not suspect this is a complication from his cirrhosis. States he was following with the liver clinic at [facility], but will be transitioning his care to [facility]  with his first appointment on August 22.  This is an appointment with a general practitioner and he is not sure what services they offer.  He will let us know if he will continue to follow with the [place] for cirrhosis care or if he would like to follow with [place] for cirrhosis. I am requesting liver biopsy and MRI results to have on file.     Plan:  Proceed with upper endoscopy +/- dilation + colonoscopy with propofol by Dr. Abbey Chatters in near future. The risks, benefits, and alternatives have been discussed with the patient in detail. The patient states understanding and desires to proceed. ASA 3 CBC, INR Anusol rectal cream twice daily x 7-10 days. Limit toilet time to 2-3 minutes. Avoid straining. Discussed importance of 2 g sodium diet. Request liver biopsy, MRI, and prior colonoscopy records from the [place]. Follow-up after procedures.  Advised to call sooner if needed.   Aliene Altes, PA-C [facility]astroenter[facility] 01/24/2022

## 2022-01-24 ENCOUNTER — Encounter: Payer: Self-pay | Admitting: *Deleted

## 2022-01-24 ENCOUNTER — Ambulatory Visit (INDEPENDENT_AMBULATORY_CARE_PROVIDER_SITE_OTHER): Payer: PPO | Admitting: Gastroenterology

## 2022-01-24 ENCOUNTER — Encounter: Payer: Self-pay | Admitting: Gastroenterology

## 2022-01-24 ENCOUNTER — Telehealth: Payer: Self-pay | Admitting: *Deleted

## 2022-01-24 ENCOUNTER — Ambulatory Visit (INDEPENDENT_AMBULATORY_CARE_PROVIDER_SITE_OTHER): Payer: No Typology Code available for payment source

## 2022-01-24 VITALS — BP 136/85 | HR 73 | Temp 97.9°F | Ht 70.0 in | Wt 283.2 lb

## 2022-01-24 DIAGNOSIS — J309 Allergic rhinitis, unspecified: Secondary | ICD-10-CM

## 2022-01-24 DIAGNOSIS — K625 Hemorrhage of anus and rectum: Secondary | ICD-10-CM

## 2022-01-24 DIAGNOSIS — R110 Nausea: Secondary | ICD-10-CM

## 2022-01-24 DIAGNOSIS — K921 Melena: Secondary | ICD-10-CM | POA: Diagnosis not present

## 2022-01-24 DIAGNOSIS — R1310 Dysphagia, unspecified: Secondary | ICD-10-CM | POA: Diagnosis not present

## 2022-01-24 DIAGNOSIS — M6208 Separation of muscle (nontraumatic), other site: Secondary | ICD-10-CM

## 2022-01-24 DIAGNOSIS — K7460 Unspecified cirrhosis of liver: Secondary | ICD-10-CM

## 2022-01-24 MED ORDER — HYDROCORTISONE (PERIANAL) 2.5 % EX CREA: 1.0000 | TOPICAL_CREAM | Freq: Two times a day (BID) | CUTANEOUS | 1 refills | Status: DC

## 2022-01-24 MED ORDER — PEG 3350-KCL-NA BICARB-NACL 420 G PO SOLR: 4000.0000 mL | Freq: Once | ORAL | 0 refills | Status: AC

## 2022-01-24 NOTE — Patient Instructions (Addendum)
Please have blood work completed at [facility].   We will arrange for you to have an upper endoscopy with possible dilation of your esophagus and colonoscopy in the near future with Dr. Abbey Chatters.   Use anusol rectal cream twice daily for suspected hemorrhoids that may be contributing to rectal bleeding. I have sent a Rx to your pharmacy.   Limit toilet time to 2-3 minutes.   Avoid straining.    We will see you back after your procedures. Do not hesitate to call if you have any questions or concerns prior to your next visit.   It was nice to meet you today!   Aliene Altes, PA-C [facility] Gastroenter[facility]

## 2022-01-24 NOTE — Telephone Encounter (Signed)
Scheduled pt for TCS/EGD +/- dil. On 02/28/22 at 8:30 am. Instructions to be mailed once get pre-op appt. Prep sent to pharmacy.

## 2022-01-25 LAB — CBC WITH DIFFERENTIAL/PLATELET
Basophils Absolute: 0.1 10*3/uL (ref 0.0–0.2)
Basos: 1 %
EOS (ABSOLUTE): 0.2 10*3/uL (ref 0.0–0.4)
Eos: 3 %
Hematocrit: 42.2 % (ref 37.5–51.0)
Hemoglobin: 15.1 g/dL (ref 13.0–17.7)
Immature Grans (Abs): 0 10*3/uL (ref 0.0–0.1)
Immature Granulocytes: 1 %
Lymphocytes Absolute: 2.4 10*3/uL (ref 0.7–3.1)
Lymphs: 37 %
MCH: 32.1 pg (ref 26.6–33.0)
MCHC: 35.8 g/dL — ABNORMAL HIGH (ref 31.5–35.7)
MCV: 90 fL (ref 79–97)
Monocytes Absolute: 0.6 10*3/uL (ref 0.1–0.9)
Monocytes: 9 %
Neutrophils Absolute: 3.1 10*3/uL (ref 1.4–7.0)
Neutrophils: 49 %
Platelets: 242 10*3/uL (ref 150–450)
RBC: 4.7 x10E6/uL (ref 4.14–5.80)
RDW: 13.2 % (ref 11.6–15.4)
WBC: 6.4 10*3/uL (ref 3.4–10.8)

## 2022-01-25 LAB — PROTIME-INR
INR: 1 (ref 0.9–1.2)
Prothrombin Time: 10.6 s (ref 9.1–12.0)

## 2022-01-31 ENCOUNTER — Ambulatory Visit (INDEPENDENT_AMBULATORY_CARE_PROVIDER_SITE_OTHER): Payer: No Typology Code available for payment source

## 2022-01-31 DIAGNOSIS — J309 Allergic rhinitis, unspecified: Secondary | ICD-10-CM | POA: Diagnosis not present

## 2022-02-05 ENCOUNTER — Encounter: Payer: Self-pay | Admitting: *Deleted

## 2022-02-05 ENCOUNTER — Telehealth: Payer: Self-pay | Admitting: *Deleted

## 2022-02-05 NOTE — Telephone Encounter (Signed)
Pt left message on VM to r/s procedure.  Called pt, LMOVM to call back

## 2022-02-05 NOTE — Telephone Encounter (Signed)
Patient rescheduled procedure until 03/05/22 at 11:15. Patient advised that new set of instructions and pre-op appointment will be mailed to him

## 2022-02-07 ENCOUNTER — Ambulatory Visit (INDEPENDENT_AMBULATORY_CARE_PROVIDER_SITE_OTHER): Payer: No Typology Code available for payment source | Admitting: Family

## 2022-02-07 ENCOUNTER — Ambulatory Visit: Payer: Self-pay

## 2022-02-07 ENCOUNTER — Encounter: Payer: Self-pay | Admitting: Family

## 2022-02-07 ENCOUNTER — Ambulatory Visit: Payer: PPO | Admitting: Gastroenterology

## 2022-02-07 DIAGNOSIS — R0602 Shortness of breath: Secondary | ICD-10-CM

## 2022-02-07 DIAGNOSIS — J3089 Other allergic rhinitis: Secondary | ICD-10-CM

## 2022-02-07 DIAGNOSIS — L299 Pruritus, unspecified: Secondary | ICD-10-CM

## 2022-02-07 DIAGNOSIS — J302 Other seasonal allergic rhinitis: Secondary | ICD-10-CM

## 2022-02-07 DIAGNOSIS — J309 Allergic rhinitis, unspecified: Secondary | ICD-10-CM

## 2022-02-07 MED ORDER — CETIRIZINE HCL 10 MG PO TABS: 10.0000 mg | ORAL_TABLET | Freq: Two times a day (BID) | ORAL | 2 refills | Status: DC

## 2022-02-07 NOTE — Patient Instructions (Addendum)
1. SOB (shortness of breath) December 08, 2021 chest x-ray showed no active cardiopulmonary disease - I am not convinced that this is asthma either. You can stop Spiriva since it did not seem to make a difference in your symptoms - Keep your appointment with pulmonary at the [place] when this is scheduled.  2. Seasonal and perennial allergic rhinitis - with itching - Testing on 12/06/21 was positive to: grasses, ragweed, weeds, indoor molds, outdoor molds, dust mites, and cat. - Continue with: Zyrtec (cetirizine) '10mg'$  one tablet twice daly - Continue with: Flonase (fluticasone) one spray per nostril daily (AIM FOR EAR ON EACH SIDE) - Consider nasal saline rinses 1-2 times daily to remove allergens from the nasal cavities as well as help with mucous clearance (this is especially helpful to do before the nasal sprays are given) -Continue allergy injections per protocol and have access to your epinephrine auto injector device on days that you get your allergy injections  3. Pruritis (itching) - Stop triamcinolone cream due it not helping - Restart cetirizine twice a day -Consider labs in the future  4. Schedule a follow up appointment in 3 months or sooner if needed            Reducing Pollen Exposure  The American Academy of Allergy, Asthma and Immunology suggests the following steps to reduce your exposure to pollen during allergy seasons.    Do not hang sheets or clothing out to dry; pollen may collect on these items. Do not mow lawns or spend time around freshly cut grass; mowing stirs up pollen. Keep windows closed at night.  Keep car windows closed while driving. Minimize morning activities outdoors, a time when pollen counts are usually at their highest. Stay indoors as much as possible when pollen counts or humidity is high and on windy days when pollen tends to remain in the air longer. Use air conditioning when possible.  Many air conditioners have filters that trap the pollen  spores. Use a HEPA room air filter to remove pollen form the indoor air you breathe.  Control of Mold Allergen   Mold and fungi can grow on a variety of surfaces provided certain temperature and moisture conditions exist.  Outdoor molds grow on plants, decaying vegetation and soil.  The major outdoor mold, Alternaria and Cladosporium, are found in very high numbers during hot and dry conditions.  Generally, a late Summer - Fall peak is seen for common outdoor fungal spores.  Rain will temporarily lower outdoor mold spore count, but counts rise rapidly when the rainy period ends.  The most important indoor molds are Aspergillus and Penicillium.  Dark, humid and poorly ventilated basements are ideal sites for mold growth.  The next most common sites of mold growth are the bathroom and the kitchen.  Outdoor (Seasonal) Mold Control  Positive outdoor molds via skin testing: Alternaria, Cladosporium, Bipolaris (Helminthsporium), and Drechslera (Curvalaria)  Use air conditioning and keep windows closed Avoid exposure to decaying vegetation. Avoid leaf raking. Avoid grain handling. Consider wearing a face mask if working in moldy areas.    Indoor (Perennial) Mold Control   Positive indoor molds via skin testing: Aspergillus and Penicillium  Maintain humidity below 50%. Clean washable surfaces with 5% bleach solution. Remove sources e.g. contaminated carpets.    Control of Dust Mite Allergen    Dust mites play a major role in allergic asthma and rhinitis.  They occur in environments with high humidity wherever human skin is found.  Dust mites absorb  humidity from the atmosphere (ie, they do not drink) and feed on organic matter (including shed human and animal skin).  Dust mites are a microscopic type of insect that you cannot see with the naked eye.  High levels of dust mites have been detected from mattresses, pillows, carpets, upholstered furniture, bed covers, clothes, soft toys and any  woven material.  The principal allergen of the dust mite is found in its feces.  A gram of dust may contain 1,000 mites and 250,000 fecal particles.  Mite antigen is easily measured in the air during house cleaning activities.  Dust mites do not bite and do not cause harm to humans, other than by triggering allergies/asthma.    Ways to decrease your exposure to dust mites in your home:  Encase mattresses, box springs and pillows with a mite-impermeable barrier or cover   Wash sheets, blankets and drapes weekly in hot water (130 F) with detergent and dry them in a dryer on the hot setting.  Have the room cleaned frequently with a vacuum cleaner and a damp dust-mop.  For carpeting or rugs, vacuuming with a vacuum cleaner equipped with a high-efficiency particulate air (HEPA) filter.  The dust mite allergic individual should not be in a room which is being cleaned and should wait 1 hour after cleaning before going into the room. Do not sleep on upholstered furniture (eg, couches).   If possible removing carpeting, upholstered furniture and drapery from the home is ideal.  Horizontal blinds should be eliminated in the rooms where the person spends the most time (bedroom, study, television room).  Washable vinyl, roller-type shades are optimal. Remove all non-washable stuffed toys from the bedroom.  Wash stuffed toys weekly like sheets and blankets above.   Reduce indoor humidity to less than 50%.  Inexpensive humidity monitors can be purchased at most hardware stores.  Do not use a humidifier as can make the problem worse and are not recommended.  Control of Dog or Cat Allergen  Avoidance is the best way to manage a dog or cat allergy. If you have a dog or cat and are allergic to dog or cats, consider removing the dog or cat from the home. If you have a dog or cat but don't want to find it a new home, or if your family wants a pet even though someone in the household is allergic, here are some strategies  that may help keep symptoms at bay:  Keep the pet out of your bedroom and restrict it to only a few rooms. Be advised that keeping the dog or cat in only one room will not limit the allergens to that room. Don't pet, hug or kiss the dog or cat; if you do, wash your hands with soap and water. High-efficiency particulate air (HEPA) cleaners run continuously in a bedroom or living room can reduce allergen levels over time. Regular use of a high-efficiency vacuum cleaner or a central vacuum can reduce allergen levels. Giving your dog or cat a bath at least once a week can reduce airborne allergen.

## 2022-02-07 NOTE — Progress Notes (Signed)
RE: Alexander Gutierrez MRN: [id number] DOB: 23-Jul-1960 Date of Telemedicine Visit: 02/07/2022  Referring provider: Beverly Milch, NP Primary care provider: Beverly Milch, NP  Chief Complaint: Shortness of Breath (Comes and goes ), Allergic Rhinitis , and Pruritus (Is still itching on arms and the triamcinolone doesn't seem to work)   Telemedicine Follow Up Visit via Telephone: I connected with Alexander Gutierrez for a follow up on 02/07/22 by telephone and verified that I am speaking with the correct person using two identifiers.   I discussed the limitations, risks, security and privacy concerns of performing an evaluation and management service by telephone and the availability of in person appointments. I also discussed with the patient that there may be a patient responsible charge related to this service. The patient expressed understanding and agreed to proceed.  Patient is at home  Provider is at the office.  Visit start time: 2:29 PM Visit end time: 2:55 AM Insurance consent/check in by: Larina Bras Medical consent and medical assistant/nurse: Dallie Piles.  History of Present Illness: He is a 61 y.o. male, who is being followed for shortness of breath, OSA-on CPAP followed by Dr. Halford Chessman, seasonal and perennial allergic rhinitis, chronic back pain, and PTSD. His previous allergy office visit was on December 06, 2021 with Dr. Ernst Bowler.  He mentions that he has an upcoming colonoscopy and endoscopy on March 05, 2022.  Shortness of breath: He continues to have shortness of breath that comes and goes.  He cannot tell any difference with the Spiriva he was given by his primary care physician.  He is currently not using the Spiriva right now because he is not having any symptoms.  Albuterol does not really help, but he mentions it may help for short bit.  He has previously been on other inhalers by other pulmonologists and it did not make any difference in his symptoms.  When he has a shortness of breath  he will also have coughing, wheezing and tightness in his chest.  He is not having the symptoms now.  The symptoms seem to come on for several months and will last for 3 to 6 months and then go away for several months.They never occur the same time each year.  When the symptoms start it then progresses into a "liquidy feeling in his lungs with a bad cough and raspy voice."  He is given a Z-Pak and prednisone and this helps it go away.  He is usually having to take the Z-Pak and prednisone 2-3 times a year.  He reports that the [place] pulmonologist did not think he had asthma.  He reports that he has had multiple full PFTs and they were normal, but none were done when he was having symptoms.  He also has had a CT chest November 2021 showing, "IMPRESSION: 1. No evidence of pulmonary embolism. 2. Mild airways thickening, could reflect acute or chronic bronchitis versus reactive airways disease. 3. No other acute intrathoracic process. 4. Hepatic steatosis. 5. Mild bilateral perinephric stranding, nonspecific and possibly related to age or diminished renal function though can be seen in the setting of urinary tract infection. Correlate for urinary symptoms and consider urinalysis as clinically warranted".    He also reports that he has had a stress test that was normal and he also had an echocardiogram March 01, 2021 that was normal.  The symptoms all started 30 years ago after chemical exposure in the [place].  He did recently transfer to the [place] in  [place] and saw his new primary care physician there.  He does plan on seeing the pulmonologist there at the [facility] [place] in [place].  Seasonal and perennial allergic rhinitis: He is currently taking Zyrtec 10 mg once a day and receives allergy injections per protocol.  He also has Flonase to use as needed.  He denies rhinorrhea, nasal congestion, and postnasal drip.  When he did try the Zyrtec 10 mg twice a day he mentions that it did not help with the  pruritus.  Triamcinolone did not help either.  He reports that the pruritus occurs on his right or left forearm  and usually is just one arm, but at times it can be both arms.  There is no rash or hives when he has the pruritus.  Pruritus is occurring a couple times a week, but when it does itch really bad.  The pruritus is a deep itch that he feels like it is in his nerves and not the surface.  He has not ever seen neurology for this.  Discussed also that with his stage IV liver cirrhosis that this could be causing his itching.  Assessment and Plan: [name] is a 61 y.o. male with: Patient Instructions  1. SOB (shortness of breath) December 08, 2021 chest x-ray showed no active cardiopulmonary disease - I am not convinced that this is asthma either. You can stop Spiriva since it did not seem to make a difference in your symptoms - Keep your appointment with pulmonary at the [place] when this is scheduled.  2. Seasonal and perennial allergic rhinitis - with itching - Testing on 12/06/21 was positive to: grasses, ragweed, weeds, indoor molds, outdoor molds, dust mites, and cat. - Continue with: Zyrtec (cetirizine) '10mg'$  one tablet twice daly - Continue with: Flonase (fluticasone) one spray per nostril daily (AIM FOR EAR ON EACH SIDE) - Consider nasal saline rinses 1-2 times daily to remove allergens from the nasal cavities as well as help with mucous clearance (this is especially helpful to do before the nasal sprays are given) -Continue allergy injections per protocol and have access to your epinephrine auto injector device on days that you get your allergy injections  3. Pruritis (itching) - Stop triamcinolone cream due it not helping - Restart cetirizine twice a day -Consider labs in the future  4. Schedule a follow up appointment in 3 months or sooner if needed            Reducing Pollen Exposure  The American Academy of Allergy, Asthma and Immunology suggests the following steps to reduce  your exposure to pollen during allergy seasons.    Do not hang sheets or clothing out to dry; pollen may collect on these items. Do not mow lawns or spend time around freshly cut grass; mowing stirs up pollen. Keep windows closed at night.  Keep car windows closed while driving. Minimize morning activities outdoors, a time when pollen counts are usually at their highest. Stay indoors as much as possible when pollen counts or humidity is high and on windy days when pollen tends to remain in the air longer. Use air conditioning when possible.  Many air conditioners have filters that trap the pollen spores. Use a HEPA room air filter to remove pollen form the indoor air you breathe.  Control of Mold Allergen   Mold and fungi can grow on a variety of surfaces provided certain temperature and moisture conditions exist.  Outdoor molds grow on plants, decaying vegetation and soil.  The major outdoor  mold, Alternaria and Cladosporium, are found in very high numbers during hot and dry conditions.  Generally, a late Summer - Fall peak is seen for common outdoor fungal spores.  Rain will temporarily lower outdoor mold spore count, but counts rise rapidly when the rainy period ends.  The most important indoor molds are Aspergillus and Penicillium.  Dark, humid and poorly ventilated basements are ideal sites for mold growth.  The next most common sites of mold growth are the bathroom and the kitchen.  Outdoor (Seasonal) Mold Control  Positive outdoor molds via skin testing: Alternaria, Cladosporium, Bipolaris (Helminthsporium), and Drechslera (Curvalaria)  Use air conditioning and keep windows closed Avoid exposure to decaying vegetation. Avoid leaf raking. Avoid grain handling. Consider wearing a face mask if working in moldy areas.    Indoor (Perennial) Mold Control   Positive indoor molds via skin testing: Aspergillus and Penicillium  Maintain humidity below 50%. Clean washable surfaces with 5%  bleach solution. Remove sources e.g. contaminated carpets.    Control of Dust Mite Allergen    Dust mites play a major role in allergic asthma and rhinitis.  They occur in environments with high humidity wherever human skin is found.  Dust mites absorb humidity from the atmosphere (ie, they do not drink) and feed on organic matter (including shed human and animal skin).  Dust mites are a microscopic type of insect that you cannot see with the naked eye.  High levels of dust mites have been detected from mattresses, pillows, carpets, upholstered furniture, bed covers, clothes, soft toys and any woven material.  The principal allergen of the dust mite is found in its feces.  A gram of dust may contain 1,000 mites and 250,000 fecal particles.  Mite antigen is easily measured in the air during house cleaning activities.  Dust mites do not bite and do not cause harm to humans, other than by triggering allergies/asthma.    Ways to decrease your exposure to dust mites in your home:  Encase mattresses, box springs and pillows with a mite-impermeable barrier or cover   Wash sheets, blankets and drapes weekly in hot water (130 F) with detergent and dry them in a dryer on the hot setting.  Have the room cleaned frequently with a vacuum cleaner and a damp dust-mop.  For carpeting or rugs, vacuuming with a vacuum cleaner equipped with a high-efficiency particulate air (HEPA) filter.  The dust mite allergic individual should not be in a room which is being cleaned and should wait 1 hour after cleaning before going into the room. Do not sleep on upholstered furniture (eg, couches).   If possible removing carpeting, upholstered furniture and drapery from the home is ideal.  Horizontal blinds should be eliminated in the rooms where the person spends the most time (bedroom, study, television room).  Washable vinyl, roller-type shades are optimal. Remove all non-washable stuffed toys from the bedroom.  Wash stuffed  toys weekly like sheets and blankets above.   Reduce indoor humidity to less than 50%.  Inexpensive humidity monitors can be purchased at most hardware stores.  Do not use a humidifier as can make the problem worse and are not recommended.  Control of Dog or Cat Allergen  Avoidance is the best way to manage a dog or cat allergy. If you have a dog or cat and are allergic to dog or cats, consider removing the dog or cat from the home. If you have a dog or cat but don't want to find it  a new home, or if your family wants a pet even though someone in the household is allergic, here are some strategies that may help keep symptoms at bay:  Keep the pet out of your bedroom and restrict it to only a few rooms. Be advised that keeping the dog or cat in only one room will not limit the allergens to that room. Don't pet, hug or kiss the dog or cat; if you do, wash your hands with soap and water. High-efficiency particulate air (HEPA) cleaners run continuously in a bedroom or living room can reduce allergen levels over time. Regular use of a high-efficiency vacuum cleaner or a central vacuum can reduce allergen levels. Giving your dog or cat a bath at least once a week can reduce airborne allergen.       Return in about 3 months (around 05/10/2022), or if symptoms worsen or fail to improve.  Meds ordered this encounter  Medications   cetirizine (ZYRTEC) 10 MG tablet    Sig: Take 1 tablet (10 mg total) by mouth 2 (two) times daily.    Dispense:  60 tablet    Refill:  2   Lab Orders  No laboratory test(s) ordered today    Diagnostics: None.  Medication List:  Current Outpatient Medications  Medication Sig Dispense Refill   allopurinol (ZYLOPRIM) 100 MG tablet Take 100 mg by mouth daily.     atorvastatin (LIPITOR) 20 MG tablet Take 20 mg by mouth daily.     Azelastine HCl (ASTEPRO) 0.15 % SOLN Place 1 spray into the nose in the morning.     EPINEPHrine 0.3 mg/0.3 mL IJ SOAJ injection Inject  0.3 mg into the muscle as needed for anaphylaxis. 1 each 1   fluticasone (FLONASE) 50 MCG/ACT nasal spray Place 1 spray into both nostrils every evening. 16 g 2   furosemide (LASIX) 20 MG tablet Take 20 mg by mouth 2 (two) times daily.     hydrocortisone (ANUSOL-HC) 2.5 % rectal cream Place 1 Application rectally 2 (two) times daily. 30 g 1   propranolol (INDERAL) 10 MG tablet Take 10 mg by mouth daily.     Sertraline HCl (ZOLOFT PO) Take 1 tablet by mouth daily.     tadalafil (CIALIS) 5 MG tablet Take 5 mg by mouth daily as needed for erectile dysfunction.     triamcinolone cream (KENALOG) 0.1 % Apply 1 Application topically 2 (two) times daily. 454 g 1   albuterol (PROVENTIL) (2.5 MG/3ML) 0.083% nebulizer solution Take 3 mLs (2.5 mg total) by nebulization every 6 (six) hours as needed for wheezing or shortness of breath. 75 mL 2   cetirizine (ZYRTEC) 10 MG tablet Take 1 tablet (10 mg total) by mouth 2 (two) times daily. 60 tablet 2   diazepam (VALIUM) 5 MG tablet Take 1 tablet (5 mg total) by mouth every 6 (six) hours as needed for anxiety (spasms). (Patient not taking: Reported on 02/07/2022) 10 tablet 0   No current facility-administered medications for this visit.   Allergies: No Known Allergies I reviewed his past medical history, social history, family history, and environmental history and no significant changes have been reported from previous visit on December 06, 2021.  Review of Systems Objective: Physical Exam Not obtained as encounter was done via telephone.   Previous notes and tests were reviewed.  I discussed the assessment and treatment plan with the patient. The patient was provided an opportunity to ask questions and all were answered. The patient agreed with  the plan and demonstrated an understanding of the instructions.   The patient was advised to call back or seek an in-person evaluation if the symptoms worsen or if the condition fails to improve as anticipated.  I  provided 26 minutes of non-face-to-face time during this encounter.  It was my pleasure to participate in [place] care today. Please feel free to contact me with any questions or concerns.   Sincerely,  Althea Charon, FNP

## 2022-02-26 ENCOUNTER — Other Ambulatory Visit (HOSPITAL_COMMUNITY): Payer: PPO

## 2022-02-27 NOTE — Patient Instructions (Signed)
Alexander Gutierrez  02/27/2022     '@PREFPERIOPPHARMACY'$ @   Your procedure is scheduled on  03/05/2022.   Report to [facility] at  [place].   Call this number if you have problems the morning of surgery:  [phone]   Remember:  Follow the diet and prep instructions given to you by the office.       Use your nebulizer before you come and bring your rescue inhaler with you.     Take these medicines the morning of surgery with A SIP OF WATER                                       zyrtec, propranolol, zoloft.     Do not wear jewelry, make-up or nail polish.  Do not wear lotions, powders, or perfumes, or deodorant.  Do not shave 48 hours prior to surgery.  Men may shave face and neck.  Do not bring valuables to the hospital.  [facility] is not responsible for any belongings or valuables.  Contacts, dentures or bridgework may not be worn into surgery.  Leave your suitcase in the car.  After surgery it may be brought to your room.  For patients admitted to the hospital, discharge time will be determined by your treatment team.  Patients discharged the day of surgery will not be allowed to drive home and must have someone with them for 24 hours.    Special instructions:   DO NOT smoke tobacco or vape for 24 hours before your procedure.  Please read over the following fact sheets that you were given. Anesthesia Post-op Instructions and Care and Recovery After Surgery      Upper Endoscopy, Adult, Care After After the procedure, it is common to have a sore throat. It is also common to have: Mild stomach pain or discomfort. Bloating. Nausea. Follow these instructions at home: The instructions below may help you care for yourself at home. Your health care provider may give you more instructions. If you have questions, ask your health care provider. If you were given a sedative during the procedure, it can affect you for several hours. Do not drive or operate  machinery until your health care provider says that it is safe. If you will be going home right after the procedure, plan to have a responsible adult: Take you home from the hospital or clinic. You will not be allowed to drive. Care for you for the time you are told. Follow instructions from your health care provider about what you may eat and drink. Return to your normal activities as told by your health care provider. Ask your health care provider what activities are safe for you. Take over-the-counter and prescription medicines only as told by your health care provider. Contact a health care provider if you: Have a sore throat that lasts longer than one day. Have trouble swallowing. Have a fever. Get help right away if you: Vomit blood or your vomit looks like coffee grounds. Have bloody, black, or tarry stools. Have a very bad sore throat or you cannot swallow. Have difficulty breathing or very bad pain in your chest or abdomen. These symptoms may be an emergency. Get help right away. Call 911. Do not wait to see if the symptoms will go away. Do not drive yourself to the hospital. Summary After the  procedure, it is common to have a sore throat, mild stomach discomfort, bloating, and nausea. If you were given a sedative during the procedure, it can affect you for several hours. Do not drive until your health care provider says that it is safe. Follow instructions from your health care provider about what you may eat and drink. Return to your normal activities as told by your health care provider. This information is not intended to replace advice given to you by your health care provider. Make sure you discuss any questions you have with your health care provider. Document Revised: 09/13/2021 Document Reviewed: 09/13/2021 Elsevier Patient Education  [place]. Esophageal Dilatation Esophageal dilatation, also called esophageal dilation, is a procedure to widen or open a  blocked or narrowed part of the esophagus. The esophagus is the part of the body that moves food and liquid from the mouth to the stomach. You may need this procedure if: You have a buildup of scar tissue in your esophagus that makes it difficult, painful, or impossible to swallow. This can be caused by gastroesophageal reflux disease (GERD). You have cancer of the esophagus. There is a problem with how food moves through your esophagus. In some cases, you may need this procedure repeated at a later time to dilate the esophagus gradually. Tell a health care provider about: Any allergies you have. All medicines you are taking, including vitamins, herbs, eye drops, creams, and over-the-counter medicines. Any problems you or family members have had with anesthetic medicines. Any blood disorders you have. Any surgeries you have had. Any medical conditions you have. Any antibiotic medicines you are required to take before dental procedures. Whether you are pregnant or may be pregnant. What are the risks? Generally, this is a safe procedure. However, problems may occur, including: Bleeding due to a tear in the lining of the esophagus. A hole, or perforation, in the esophagus. What happens before the procedure? Ask your health care provider about: Changing or stopping your regular medicines. This is especially important if you are taking diabetes medicines or blood thinners. Taking medicines such as aspirin and ibuprofen. These medicines can thin your blood. Do not take these medicines unless your health care provider tells you to take them. Taking over-the-counter medicines, vitamins, herbs, and supplements. Follow instructions from your health care provider about eating or drinking restrictions. Plan to have a responsible adult take you home from the hospital or clinic. Plan to have a responsible adult care for you for the time you are told after you leave the hospital or clinic. This is  important. What happens during the procedure? You may be given a medicine to help you relax (sedative). A numbing medicine may be sprayed into the back of your throat, or you may gargle the medicine. Your health care provider may perform the dilatation using various surgical instruments, such as: Simple dilators. This instrument is carefully placed in the esophagus to stretch it. Guided wire bougies. This involves using an endoscope to insert a wire into the esophagus. A dilator is passed over this wire to enlarge the esophagus. Then the wire is removed. Balloon dilators. An endoscope with a small balloon is inserted into the esophagus. The balloon is inflated to stretch the esophagus and open it up. The procedure may vary among health care providers and hospitals. What can I expect after the procedure? Your blood pressure, heart rate, breathing rate, and blood oxygen level will be monitored until you leave the hospital or clinic. Your throat  may feel slightly sore and numb. This will get better over time. You will not be allowed to eat or drink until your throat is no longer numb. When you are able to drink, urinate, and sit on the edge of the bed without nausea or dizziness, you may be able to return home. Follow these instructions at home: Take over-the-counter and prescription medicines only as told by your health care provider. If you were given a sedative during the procedure, it can affect you for several hours. Do not drive or operate machinery until your health care provider says that it is safe. Plan to have a responsible adult care for you for the time you are told. This is important. Follow instructions from your health care provider about any eating or drinking restrictions. Do not use any products that contain nicotine or tobacco, such as cigarettes, e-cigarettes, and chewing tobacco. If you need help quitting, ask your health care provider. Keep all follow-up visits. This is  important. Contact a health care provider if: You have a fever. You have pain that is not relieved by medicine. Get help right away if: You have chest pain. You have trouble breathing. You have trouble swallowing. You vomit blood. You have black, tarry, or bloody stools. These symptoms may represent a serious problem that is an emergency. Do not wait to see if the symptoms will go away. Get medical help right away. Call your local emergency services (911 in the [place] Do not drive yourself to the hospital. Summary Esophageal dilatation, also called esophageal dilation, is a procedure to widen or open a blocked or narrowed part of the esophagus. Plan to have a responsible adult take you home from the hospital or clinic. For this procedure, a numbing medicine may be sprayed into the back of your throat, or you may gargle the medicine. Do not drive or operate machinery until your health care provider says that it is safe. This information is not intended to replace advice given to you by your health care provider. Make sure you discuss any questions you have with your health care provider. Document Revised: 10/21/2019 Document Reviewed: 10/21/2019 Elsevier Patient Education  [place]. Colonoscopy, Adult, Care After The following information offers guidance on how to care for yourself after your procedure. Your health care provider may also give you more specific instructions. If you have problems or questions, contact your health care provider. What can I expect after the procedure? After the procedure, it is common to have: A small amount of blood in your stool for 24 hours after the procedure. Some gas. Mild cramping or bloating of your abdomen. Follow these instructions at home: Eating and drinking  Drink enough fluid to keep your urine pale yellow. Follow instructions from your health care provider about eating or drinking restrictions. Resume your normal diet as told by  your health care provider. Avoid heavy or fried foods that are hard to digest. Activity Rest as told by your health care provider. Avoid sitting for a long time without moving. Get up to take short walks every 1-2 hours. This is important to improve blood flow and breathing. Ask for help if you feel weak or unsteady. Return to your normal activities as told by your health care provider. Ask your health care provider what activities are safe for you. Managing cramping and bloating  Try walking around when you have cramps or feel bloated. If directed, apply heat to your abdomen as told by your health care provider.  Use the heat source that your health care provider recommends, such as a moist heat pack or a heating pad. Place a towel between your skin and the heat source. Leave the heat on for 20-30 minutes. Remove the heat if your skin turns bright red. This is especially important if you are unable to feel pain, heat, or cold. You have a greater risk of getting burned. General instructions If you were given a sedative during the procedure, it can affect you for several hours. Do not drive or operate machinery until your health care provider says that it is safe. For the first 24 hours after the procedure: Do not sign important documents. Do not drink alcohol. Do your regular daily activities at a slower pace than normal. Eat soft foods that are easy to digest. Take over-the-counter and prescription medicines only as told by your health care provider. Keep all follow-up visits. This is important. Contact a health care provider if: You have blood in your stool 2-3 days after the procedure. Get help right away if: You have more than a small spotting of blood in your stool. You have large blood clots in your stool. You have swelling of your abdomen. You have nausea or vomiting. You have a fever. You have increasing pain in your abdomen that is not relieved with medicine. These symptoms may  be an emergency. Get help right away. Call 911. Do not wait to see if the symptoms will go away. Do not drive yourself to the hospital. Summary After the procedure, it is common to have a small amount of blood in your stool. You may also have mild cramping and bloating of your abdomen. If you were given a sedative during the procedure, it can affect you for several hours. Do not drive or operate machinery until your health care provider says that it is safe. Get help right away if you have a lot of blood in your stool, nausea or vomiting, a fever, or increased pain in your abdomen. This information is not intended to replace advice given to you by your health care provider. Make sure you discuss any questions you have with your health care provider. Document Revised: 01/25/2021 Document Reviewed: 01/25/2021 Elsevier Patient Education  [place] After This sheet gives you information about how to care for yourself after your procedure. Your health care provider may also give you more specific instructions. If you have problems or questions, contact your health care provider. What can I expect after the procedure? After the procedure, it is common to have: Tiredness. Forgetfulness about what happened after the procedure. Impaired judgment for important decisions. Nausea or vomiting. Some difficulty with balance. Follow these instructions at home: For the time period you were told by your health care provider:     Rest as needed. Do not participate in activities where you could fall or become injured. Do not drive or use machinery. Do not drink alcohol. Do not take sleeping pills or medicines that cause drowsiness. Do not make important decisions or sign legal documents. Do not take care of children on your own. Eating and drinking Follow the diet that is recommended by your health care provider. Drink enough fluid to keep your urine pale  yellow. If you vomit: Drink water, juice, or soup when you can drink without vomiting. Make sure you have little or no nausea before eating solid foods. General instructions Have a responsible adult stay with you for the time you are  told. It is important to have someone help care for you until you are awake and alert. Take over-the-counter and prescription medicines only as told by your health care provider. If you have sleep apnea, surgery and certain medicines can increase your risk for breathing problems. Follow instructions from your health care provider about wearing your sleep device: Anytime you are sleeping, including during daytime naps. While taking prescription pain medicines, sleeping medicines, or medicines that make you drowsy. Avoid smoking. Keep all follow-up visits as told by your health care provider. This is important. Contact a health care provider if: You keep feeling nauseous or you keep vomiting. You feel light-headed. You are still sleepy or having trouble with balance after 24 hours. You develop a rash. You have a fever. You have redness or swelling around the IV site. Get help right away if: You have trouble breathing. You have new-onset confusion at home. Summary For several hours after your procedure, you may feel tired. You may also be forgetful and have poor judgment. Have a responsible adult stay with you for the time you are told. It is important to have someone help care for you until you are awake and alert. Rest as told. Do not drive or operate machinery. Do not drink alcohol or take sleeping pills. Get help right away if you have trouble breathing, or if you suddenly become confused. This information is not intended to replace advice given to you by your health care provider. Make sure you discuss any questions you have with your health care provider. Document Revised: 05/09/2021 Document Reviewed: 05/07/2019 Elsevier Patient Education  [place].

## 2022-02-28 ENCOUNTER — Encounter (HOSPITAL_COMMUNITY)
Admission: RE | Disposition: A | Discharge status: Home / Self Care | Payer: Self-pay | Source: Home / Self Care | Attending: Internal Medicine

## 2022-02-28 ENCOUNTER — Encounter (HOSPITAL_COMMUNITY): Payer: PPO

## 2022-02-28 SURGERY — COLONOSCOPY WITH PROPOFOL
Anesthesia: Monitor Anesthesia Care

## 2022-03-01 ENCOUNTER — Encounter (HOSPITAL_COMMUNITY)
Admission: RE | Admit: 2022-03-01 | Discharge: 2022-03-01 | Disposition: A | Discharge status: Home / Self Care | Payer: PPO | Source: Ambulatory Visit | Attending: Internal Medicine | Admitting: Internal Medicine

## 2022-03-01 ENCOUNTER — Encounter (HOSPITAL_COMMUNITY): Payer: Self-pay

## 2022-03-01 VITALS — BP 118/74 | HR 72 | Temp 97.6°F | Resp 18 | Ht 70.0 in | Wt 283.3 lb

## 2022-03-01 DIAGNOSIS — K7460 Unspecified cirrhosis of liver: Secondary | ICD-10-CM | POA: Insufficient documentation

## 2022-03-01 DIAGNOSIS — Z01818 Encounter for other preprocedural examination: Secondary | ICD-10-CM | POA: Diagnosis present

## 2022-03-01 DIAGNOSIS — R638 Other symptoms and signs concerning food and fluid intake: Secondary | ICD-10-CM | POA: Diagnosis not present

## 2022-03-01 LAB — COMPREHENSIVE METABOLIC PANEL
ALT: 26 U/L (ref 0–44)
AST: 21 U/L (ref 15–41)
Albumin: 4.1 g/dL (ref 3.5–5.0)
Alkaline Phosphatase: 62 U/L (ref 38–126)
Anion gap: 6 (ref 5–15)
BUN: 20 mg/dL (ref 8–23)
CO2: 27 mmol/L (ref 22–32)
Calcium: 9.1 mg/dL (ref 8.9–10.3)
Chloride: 104 mmol/L (ref 98–111)
Creatinine, Ser: 1.01 mg/dL (ref 0.61–1.24)
GFR, Estimated: >60 mL/min (ref >60)
Glucose, Bld: 116 mg/dL — ABNORMAL HIGH (ref 70–99)
Potassium: 4.5 mmol/L (ref 3.5–5.1)
Sodium: 137 mmol/L (ref 135–145)
Total Bilirubin: 0.8 mg/dL (ref 0.3–1.2)
Total Protein: 6.9 g/dL (ref 6.5–8.1)

## 2022-03-05 ENCOUNTER — Encounter (HOSPITAL_COMMUNITY): Payer: Self-pay

## 2022-03-05 ENCOUNTER — Ambulatory Visit (HOSPITAL_BASED_OUTPATIENT_CLINIC_OR_DEPARTMENT_OTHER): Payer: PPO | Admitting: Certified Registered Nurse Anesthetist

## 2022-03-05 ENCOUNTER — Ambulatory Visit (HOSPITAL_COMMUNITY)
Admission: RE | Admit: 2022-03-05 | Discharge: 2022-03-05 | Disposition: A | Discharge status: Home / Self Care | Payer: PPO | Attending: Internal Medicine | Admitting: Internal Medicine

## 2022-03-05 ENCOUNTER — Ambulatory Visit (HOSPITAL_COMMUNITY): Payer: PPO | Admitting: Certified Registered Nurse Anesthetist

## 2022-03-05 ENCOUNTER — Encounter (HOSPITAL_COMMUNITY)
Admission: RE | Disposition: A | Discharge status: Home / Self Care | Payer: Self-pay | Source: Home / Self Care | Attending: Internal Medicine

## 2022-03-05 DIAGNOSIS — K319 Disease of stomach and duodenum, unspecified: Secondary | ICD-10-CM | POA: Insufficient documentation

## 2022-03-05 DIAGNOSIS — K635 Polyp of colon: Secondary | ICD-10-CM | POA: Diagnosis not present

## 2022-03-05 DIAGNOSIS — Z6841 Body Mass Index (BMI) 40.0 and over, adult: Secondary | ICD-10-CM

## 2022-03-05 DIAGNOSIS — F419 Anxiety disorder, unspecified: Secondary | ICD-10-CM | POA: Insufficient documentation

## 2022-03-05 DIAGNOSIS — R638 Other symptoms and signs concerning food and fluid intake: Secondary | ICD-10-CM

## 2022-03-05 DIAGNOSIS — G4733 Obstructive sleep apnea (adult) (pediatric): Secondary | ICD-10-CM | POA: Insufficient documentation

## 2022-03-05 DIAGNOSIS — R1013 Epigastric pain: Secondary | ICD-10-CM | POA: Insufficient documentation

## 2022-03-05 DIAGNOSIS — K7460 Unspecified cirrhosis of liver: Secondary | ICD-10-CM | POA: Diagnosis not present

## 2022-03-05 DIAGNOSIS — K648 Other hemorrhoids: Secondary | ICD-10-CM | POA: Insufficient documentation

## 2022-03-05 DIAGNOSIS — R112 Nausea with vomiting, unspecified: Secondary | ICD-10-CM | POA: Diagnosis not present

## 2022-03-05 DIAGNOSIS — F32A Depression, unspecified: Secondary | ICD-10-CM | POA: Insufficient documentation

## 2022-03-05 DIAGNOSIS — K625 Hemorrhage of anus and rectum: Secondary | ICD-10-CM | POA: Diagnosis present

## 2022-03-05 DIAGNOSIS — K2970 Gastritis, unspecified, without bleeding: Secondary | ICD-10-CM

## 2022-03-05 DIAGNOSIS — D122 Benign neoplasm of ascending colon: Secondary | ICD-10-CM | POA: Insufficient documentation

## 2022-03-05 DIAGNOSIS — K219 Gastro-esophageal reflux disease without esophagitis: Secondary | ICD-10-CM | POA: Diagnosis not present

## 2022-03-05 DIAGNOSIS — R1310 Dysphagia, unspecified: Secondary | ICD-10-CM | POA: Diagnosis not present

## 2022-03-05 HISTORY — PX: COLONOSCOPY WITH PROPOFOL: SHX5780

## 2022-03-05 HISTORY — PX: ESOPHAGOGASTRODUODENOSCOPY (EGD) WITH PROPOFOL: SHX5813

## 2022-03-05 HISTORY — PX: BIOPSY: SHX5522

## 2022-03-05 HISTORY — PX: POLYPECTOMY: SHX5525

## 2022-03-05 SURGERY — COLONOSCOPY WITH PROPOFOL
Anesthesia: General

## 2022-03-05 MED ORDER — CHLORHEXIDINE GLUCONATE CLOTH 2 % EX PADS: 6.0000 | MEDICATED_PAD | Freq: Once | CUTANEOUS | Status: DC

## 2022-03-05 MED ORDER — PROPOFOL 10 MG/ML IV BOLUS
INTRAVENOUS | Status: DC | PRN
  Administered 2022-03-05: 100 mg via INTRAVENOUS

## 2022-03-05 MED ORDER — PROPOFOL 500 MG/50ML IV EMUL
INTRAVENOUS | Status: DC | PRN
  Administered 2022-03-05: 200 ug/kg/min via INTRAVENOUS

## 2022-03-05 MED ORDER — LIDOCAINE HCL (CARDIAC) PF 100 MG/5ML IV SOSY
PREFILLED_SYRINGE | INTRAVENOUS | Status: DC | PRN
  Administered 2022-03-05: 50 mg via INTRAVENOUS

## 2022-03-05 MED ORDER — LACTATED RINGERS IV SOLN: INTRAVENOUS | Status: DC | PRN

## 2022-03-05 MED ORDER — PANTOPRAZOLE SODIUM 40 MG PO TBEC: 40.0000 mg | DELAYED_RELEASE_TABLET | Freq: Two times a day (BID) | ORAL | 11 refills | Status: DC

## 2022-03-05 MED ORDER — MIDAZOLAM HCL 2 MG/2ML IJ SOLN
INTRAMUSCULAR | Status: DC | PRN
  Administered 2022-03-05: 2 mg via INTRAVENOUS

## 2022-03-05 MED ORDER — MIDAZOLAM HCL 2 MG/2ML IJ SOLN
INTRAMUSCULAR | Status: AC
  Filled 2022-03-05: qty 2

## 2022-03-05 NOTE — H&P (Signed)
Primary Care Physician:  Hyler, Gwen Her, NP (Inactive) Primary Gastroenterologist:  Dr. Abbey Chatters  Pre-Procedure History & Physical: HPI:  Alexander Gutierrez is a 61 y.o. male is here for an EGD possible dilation to be performed for dysphagia, nausea and vomiting and colonoscopy for rectal bleeding.   Past Medical History:  Diagnosis Date   ADHD    Chronic back pain    Chronic fatigue    Cirrhosis (HCC)    Depression    Erectile dysfunction    GERD (gastroesophageal reflux disease)    Gout    Hypercholesteremia    OSA (obstructive sleep apnea)    PTSD (post-traumatic stress disorder)    Vocal cord dysfunction     Past Surgical History:  Procedure Laterality Date   COLONOSCOPY     LARYNGOSCOPY      Prior to Admission medications   Medication Sig Start Date End Date Taking? Authorizing Provider  albuterol (PROVENTIL) (2.5 MG/3ML) 0.083% nebulizer solution Take 2.5 mg by nebulization every 6 (six) hours as needed for wheezing or shortness of breath.   Yes [provider]  amoxicillin (AMOXIL) 500 MG tablet Take 500 mg by mouth 2 (two) times daily. 02/28/22 03/10/22 Yes [provider]  cetirizine (ZYRTEC) 10 MG tablet Take 1 tablet (10 mg total) by mouth 2 (two) times daily. Patient taking differently: Take 10 mg by mouth daily. 02/07/22  Yes Althea Charon, FNP  EPINEPHrine 0.3 mg/0.3 mL IJ SOAJ injection Inject 0.3 mg into the muscle as needed for anaphylaxis. 12/06/21  Yes Valentina Shaggy, MD  fluticasone [facility]) 50 MCG/ACT nasal spray Place 1 spray into both nostrils every evening. Patient taking differently: Place 1 spray into both nostrils daily as needed for rhinitis. 09/13/21  Yes Chesley Mires, MD  hydrocortisone (ANUSOL-HC) 2.5 % rectal cream Place 1 Application rectally 2 (two) times daily. Patient taking differently: Place 1 Application rectally daily as needed for hemorrhoids or anal itching. 01/24/22  Yes Erenest Rasher, PA-C  ibuprofen (ADVIL) 600 MG  tablet Take 600 mg by mouth every 4 (four) hours as needed for moderate pain. Given for after dental procedure 02/28/22   Yes [provider]  propranolol (INDERAL) 10 MG tablet Take 10 mg by mouth daily.   Yes [provider]  sertraline (ZOLOFT) 100 MG tablet Take 100 mg by mouth daily.   Yes [provider]  tadalafil (CIALIS) 5 MG tablet Take 5 mg by mouth daily as needed for erectile dysfunction.   Yes [provider]  triamcinolone cream (KENALOG) 0.1 % Apply 1 Application topically 2 (two) times daily. Patient taking differently: Apply 1 Application topically daily as needed (itching). 12/06/21  Yes Valentina Shaggy, MD  allopurinol (ZYLOPRIM) 100 MG tablet Take 100 mg by mouth daily. Patient not taking: Reported on 02/26/2022    [provider]  atorvastatin (LIPITOR) 20 MG tablet Take 20 mg by mouth daily. Patient not taking: Reported on 02/26/2022    [provider]  diazepam (VALIUM) 5 MG tablet Take 1 tablet (5 mg total) by mouth every 6 (six) hours as needed for anxiety (spasms). Patient not taking: Reported on 02/07/2022 07/16/21   Veryl Speak, MD  furosemide (LASIX) 20 MG tablet Take 20 mg by mouth 2 (two) times daily. Patient not taking: Reported on 02/26/2022 02/28/21   [provider]    Allergies as of 01/24/2022   (No Known Allergies)    No family history on file.  Social History   Socioeconomic  History   Marital status: Married    Spouse name: Not on file   Number of children: Not on file   Years of education: Not on file   Highest education level: Not on file  Occupational History   Not on file  Tobacco Use   Smoking status: Never   Smokeless tobacco: Never  Substance and Sexual Activity   Alcohol use: Never   Drug use: No   Sexual activity: Not on file  Other Topics Concern   Not on file  Social History Narrative   Not on file   Social Determinants of Health   Financial Resource Strain:  Not on file  Food Insecurity: Not on file  Transportation Needs: Not on file  Physical Activity: Not on file  Stress: Not on file  Social Connections: Not on file  Intimate Partner Violence: Not on file    Review of Systems: General: Negative for fever, chills, fatigue, weakness. Eyes: Negative for vision changes.  ENT: Negative for hoarseness, difficulty swallowing , nasal congestion. CV: Negative for chest pain, angina, palpitations, dyspnea on exertion, peripheral edema.  Respiratory: Negative for dyspnea at rest, dyspnea on exertion, cough, sputum, wheezing.  GI: See history of present illness. GU:  Negative for dysuria, hematuria, urinary incontinence, urinary frequency, nocturnal urination.  MS: Negative for joint pain, low back pain.  Derm: Negative for rash or itching.  Neuro: Negative for weakness, abnormal sensation, seizure, frequent headaches, memory loss, confusion.  Psych: Negative for anxiety, depression Endo: Negative for unusual weight change.  Heme: Negative for bruising or bleeding. Allergy: Negative for rash or hives.  Physical Exam: Vital signs in last 24 hours:     General:   Alert,  Well-developed, well-nourished, pleasant and cooperative in NAD Head:  Normocephalic and atraumatic. Eyes:  Sclera clear, no icterus.   Conjunctiva pink. Ears:  Normal auditory acuity. Nose:  No deformity, discharge,  or lesions. Mouth:  No deformity or lesions, dentition normal. Neck:  Supple; no masses or thyromegaly. Lungs:  Clear throughout to auscultation.   No wheezes, crackles, or rhonchi. No acute distress. Heart:  Regular rate and rhythm; no murmurs, clicks, rubs,  or gallops. Abdomen:  Soft, nontender and nondistended. No masses, hepatosplenomegaly or hernias noted. Normal bowel sounds, without guarding, and without rebound.   Msk:  Symmetrical without gross deformities. Normal posture. Extremities:  Without clubbing or edema. Neurologic:  Alert and  oriented x4;   grossly normal neurologically. Skin:  Intact without significant lesions or rashes. Cervical Nodes:  No significant cervical adenopathy. Psych:  Alert and cooperative. Normal mood and affect.   Impression/Plan: Alexander Gutierrez is here for an EGD possible dilation to be performed for dysphagia, nausea and vomiting and colonoscopy for rectal bleeding.   Risks, benefits, limitations, imponderables and alternatives regarding EGD have been reviewed with the patient. Questions have been answered. All parties agreeable.

## 2022-03-05 NOTE — Anesthesia Preprocedure Evaluation (Signed)
Anesthesia Evaluation  Patient identified by MRN, date of birth, ID band Patient awake    Reviewed: Allergy & Precautions, H&P , NPO status , Patient's Chart, lab work & pertinent test results, reviewed documented beta blocker date and time   Airway Mallampati: II  TM Distance: >3 FB Neck ROM: full    Dental no notable dental hx.    Pulmonary sleep apnea ,    Pulmonary exam normal breath sounds clear to auscultation       Cardiovascular Exercise Tolerance: Good negative cardio ROS   Rhythm:regular Rate:Normal     Neuro/Psych PSYCHIATRIC DISORDERS Anxiety Depression negative neurological ROS     GI/Hepatic GERD  Medicated,(+) Cirrhosis       ,   Endo/Other  Morbid obesity  Renal/GU negative Renal ROS  negative genitourinary   Musculoskeletal   Abdominal   Peds  Hematology negative hematology ROS (+)   Anesthesia Other Findings   Reproductive/Obstetrics negative OB ROS                             Anesthesia Physical Anesthesia Plan  ASA: 3  Anesthesia Plan: General   Post-op Pain Management:    Induction:   PONV Risk Score and Plan: Propofol infusion  Airway Management Planned:   Additional Equipment:   Intra-op Plan:   Post-operative Plan:   Informed Consent: I have reviewed the patients History and Physical, chart, labs and discussed the procedure including the risks, benefits and alternatives for the proposed anesthesia with the patient or authorized representative who has indicated his/her understanding and acceptance.     Dental Advisory Given  Plan Discussed with: CRNA  Anesthesia Plan Comments:         Anesthesia Quick Evaluation

## 2022-03-05 NOTE — Discharge Instructions (Addendum)
EGD Discharge instructions Please read the instructions outlined below and refer to this sheet in the next few weeks. These discharge instructions provide you with general information on caring for yourself after you leave the hospital. Your doctor may also give you specific instructions. While your treatment has been planned according to the most current medical practices available, unavoidable complications occasionally occur. If you have any problems or questions after discharge, please call your doctor. ACTIVITY You may resume your regular activity but move at a slower pace for the next 24 hours.  Take frequent rest periods for the next 24 hours.  Walking will help expel (get rid of) the air and reduce the bloated feeling in your abdomen.  No driving for 24 hours (because of the anesthesia (medicine) used during the test).  You may shower.  Do not sign any important legal documents or operate any machinery for 24 hours (because of the anesthesia used during the test).  NUTRITION Drink plenty of fluids.  You may resume your normal diet.  Begin with a light meal and progress to your normal diet.  Avoid alcoholic beverages for 24 hours or as instructed by your caregiver.  MEDICATIONS You may resume your normal medications unless your caregiver tells you otherwise.  WHAT YOU CAN EXPECT TODAY You may experience abdominal discomfort such as a feeling of fullness or "gas" pains.  FOLLOW-UP Your doctor will discuss the results of your test with you.  SEEK IMMEDIATE MEDICAL ATTENTION IF ANY OF THE FOLLOWING OCCUR: Excessive nausea (feeling sick to your stomach) and/or vomiting.  Severe abdominal pain and distention (swelling).  Trouble swallowing.  Temperature over 101 F (37.8 C).  Rectal bleeding or vomiting of blood.      Colonoscopy Discharge Instructions  Read the instructions outlined below and refer to this sheet in the next few weeks. These discharge instructions provide you  with general information on caring for yourself after you leave the hospital. Your doctor may also give you specific instructions. While your treatment has been planned according to the most current medical practices available, unavoidable complications occasionally occur.   ACTIVITY You may resume your regular activity, but move at a slower pace for the next 24 hours.  Take frequent rest periods for the next 24 hours.  Walking will help get rid of the air and reduce the bloated feeling in your belly (abdomen).  No driving for 24 hours (because of the medicine (anesthesia) used during the test).   Do not sign any important legal documents or operate any machinery for 24 hours (because of the anesthesia used during the test).  NUTRITION Drink plenty of fluids.  You may resume your normal diet as instructed by your doctor.  Begin with a light meal and progress to your normal diet. Heavy or fried foods are harder to digest and may make you feel sick to your stomach (nauseated).  Avoid alcoholic beverages for 24 hours or as instructed.  MEDICATIONS You may resume your normal medications unless your doctor tells you otherwise.  WHAT YOU CAN EXPECT TODAY Some feelings of bloating in the abdomen.  Passage of more gas than usual.  Spotting of blood in your stool or on the toilet paper.  IF YOU HAD POLYPS REMOVED DURING THE COLONOSCOPY: No aspirin products for 7 days or as instructed.  No alcohol for 7 days or as instructed.  Eat a soft diet for the next 24 hours.  FINDING OUT THE RESULTS OF YOUR TEST Not all test results  are available during your visit. If your test results are not back during the visit, make an appointment with your caregiver to find out the results. Do not assume everything is normal if you have not heard from your caregiver or the medical facility. It is important for you to follow up on all of your test results.  SEEK IMMEDIATE MEDICAL ATTENTION IF: You have more than a  spotting of blood in your stool.  Your belly is swollen (abdominal distention).  You are nauseated or vomiting.  You have a temperature over 101.  You have abdominal pain or discomfort that is severe or gets worse throughout the day.   Your upper endoscopy showed moderate inflammation in your stomach including numerous superficial ulcers.  I took biopsies to rule out infection with a bacteria called H. pylori.  I am going to start you on a new medication called pantoprazole twice daily.  Avoid NSAIDs as best as you can.  Your colonoscopy revealed 2 polyp(s) which I removed successfully. Await pathology results, my office will contact you. I recommend repeating colonoscopy in 5 years for surveillance purposes.   You have diverticulosis internal hemorrhoids.  The hemorrhoids are likely the cause of your rectal bleeding.  If this becomes more frequent, we can consider hemorrhoid banding in the clinic.  Follow-up in 3 to 4 months.  I hope you have a great rest of your week!  Elon Alas. Abbey Chatters, D.O. Gastroenterology and Hepatology [facility]

## 2022-03-05 NOTE — Op Note (Signed)
[facility] Patient Name: Alexander Gutierrez Procedure Date: 03/05/2022 10:37 AM MRN: [id number] Date of Birth: [birth year] Attending MD: Elon Alas. Abbey Chatters DO CSN: [id number] Age: 61 Admit Type: Outpatient Procedure:                Upper GI endoscopy Indications:              Epigastric abdominal pain, Dysphagia, Nausea with                            vomiting Providers:                Elon Alas. Abbey Chatters, DO, Caprice Kluver, [place]                            Risa Grill, Technician Referring MD:              Medicines:                See the Anesthesia note for documentation of the                            administered medications Complications:            No immediate complications. Estimated Blood Loss:     Estimated blood loss was minimal. Procedure:                Pre-Anesthesia Assessment:                           - The anesthesia plan was to use monitored                            anesthesia care (MAC).                           After obtaining informed consent, the endoscope was                            passed under direct vision. Throughout the                            procedure, the patient's blood pressure, pulse, and                            oxygen saturations were monitored continuously. The                            [id number]-H190 ([id number]) scope was introduced through the                            mouth, and advanced to the second part of duodenum.                            The upper GI endoscopy was accomplished without                            difficulty. The patient tolerated the procedure  well. Scope In: 10:45:16 AM Scope Out: 10:48:49 AM Total Procedure Duration: 0 hours 3 minutes 33 seconds  Findings:      The Z-line was regular and was found 39 cm from the incisors.      There is no endoscopic evidence of stenosis or stricture in the entire       esophagus.      Diffuse moderate inflammation characterized by erythema,  friability and       shallow ulcerations was found in the entire examined stomach. Biopsies       were taken with a cold forceps for Helicobacter pylori testing.      The duodenal bulb, first portion of the duodenum and second portion of       the duodenum were normal. Impression:               - Z-line regular, 39 cm from the incisors.                           - Gastritis. Biopsied.                           - Normal duodenal bulb, first portion of the                            duodenum and second portion of the duodenum. Moderate Sedation:      Per Anesthesia Care Recommendation:           - Patient has a contact number available for                            emergencies. The signs and symptoms of potential                            delayed complications were discussed with the                            patient. Return to normal activities tomorrow.                            Written discharge instructions were provided to the                            patient.                           - Resume previous diet.                           - Continue present medications.                           - Await pathology results.                           - Use a proton pump inhibitor PO BID.                           - No ibuprofen, naproxen, or  other non-steroidal                            anti-inflammatory drugs.                           - Return to GI clinic in 3 months. Procedure Code(s):        --- Professional ---                           [phone], Esophagogastroduodenoscopy, flexible,                            transoral; with biopsy, single or multiple Diagnosis Code(s):        --- Professional ---                           K29.70, Gastritis, unspecified, without bleeding                           R10.13, Epigastric pain                           R13.10, Dysphagia, unspecified                           R11.2, Nausea with vomiting, unspecified CPT copyright 2019 American Medical  Association. All rights reserved. The codes documented in this report are preliminary and upon coder review may  be revised to meet current compliance requirements. Elon Alas. Abbey Chatters, DO [place] Abbey Chatters, DO 03/05/2022 10:51:21 AM This report has been signed electronically. Number of Addenda: 0

## 2022-03-05 NOTE — Transfer of Care (Signed)
Immediate Anesthesia Transfer of Care Note  Patient: Alexander Gutierrez  Procedure(s) Performed: COLONOSCOPY WITH PROPOFOL ESOPHAGOGASTRODUODENOSCOPY (EGD) WITH PROPOFOL BIOPSY POLYPECTOMY  Patient Location: Short Stay  Anesthesia Type:General  Level of Consciousness: drowsy  Airway & Oxygen Therapy: Patient Spontanous Breathing and Patient connected to nasal cannula oxygen  Post-op Assessment: Report given to RN and Post -op Vital signs reviewed and stable  Post vital signs: Reviewed and stable  Last Vitals:  Vitals Value Taken Time  BP 102/73 03/05/22 1114  Temp    Pulse 71 03/05/22 1114  Resp 15 03/05/22 1114  SpO2 95 % 03/05/22 1114    Last Pain:  Vitals:   03/05/22 1041  TempSrc:   PainSc: 0-No pain      Patients Stated Pain Goal: 5 (17/51/02 5852)  Complications: No notable events documented.

## 2022-03-05 NOTE — Op Note (Signed)
[facility] Patient Name: Alexander Gutierrez Procedure Date: 03/05/2022 10:51 AM MRN: [id number] Date of Birth: 12/05/1960 Attending MD: Elon Alas. Abbey Chatters DO CSN: [id number] Age: 61 Admit Type: Outpatient Procedure:                Colonoscopy Indications:              Rectal bleeding Providers:                Elon Alas. Abbey Chatters, DO, Caprice Kluver, [place]                            Risa Grill, Technician Referring MD:              Medicines:                See the Anesthesia note for documentation of the                            administered medications Complications:            No immediate complications. Estimated Blood Loss:     Estimated blood loss was minimal. Procedure:                Pre-Anesthesia Assessment:                           - The anesthesia plan was to use monitored                            anesthesia care (MAC).                           After obtaining informed consent, the colonoscope                            was passed under direct vision. Throughout the                            procedure, the patient's blood pressure, pulse, and                            oxygen saturations were monitored continuously. The                            [id number]-HQ190L ([id number]) scope was introduced through                            the anus and advanced to the the cecum, identified                            by appendiceal orifice and ileocecal valve. The                            colonoscopy was performed without difficulty. The                            patient tolerated the procedure well. The quality  of the bowel preparation was evaluated using the                            BBPS [facility] Bowel Preparation Scale) with scores                            of: Right Colon = 2 (minor amount of residual                            staining, small fragments of stool and/or opaque                            liquid, but mucosa seen well), Transverse Colon  = 2                            (minor amount of residual staining, small fragments                            of stool and/or opaque liquid, but mucosa seen                            well) and Left Colon = 2 (minor amount of residual                            staining, small fragments of stool and/or opaque                            liquid, but mucosa seen well). The total BBPS score                            equals 6. The quality of the bowel preparation was                            fair. Scope In: 10:52:48 AM Scope Out: 11:08:13 AM Scope Withdrawal Time: 0 hours 12 minutes 25 seconds  Total Procedure Duration: 0 hours 15 minutes 25 seconds  Findings:      The perianal and digital rectal examinations were normal.      Non-bleeding internal hemorrhoids were found during retroflexion.      A 2 mm polyp was found in the ascending colon. The polyp was sessile.       The polyp was removed with a cold biopsy forceps. Resection and       retrieval were complete.      A 5 mm polyp was found in the ascending colon. The polyp was sessile.       The polyp was removed with a cold snare. Resection and retrieval were       complete. Impression:               - Preparation of the colon was fair.                           - Non-bleeding internal hemorrhoids.                           -  One 2 mm polyp in the ascending colon, removed                            with a cold biopsy forceps. Resected and retrieved.                           - One 5 mm polyp in the ascending colon, removed                            with a cold snare. Resected and retrieved. Moderate Sedation:      Per Anesthesia Care Recommendation:           - Patient has a contact number available for                            emergencies. The signs and symptoms of potential                            delayed complications were discussed with the                            patient. Return to normal activities tomorrow.                             Written discharge instructions were provided to the                            patient.                           - Resume previous diet.                           - Continue present medications.                           - Await pathology results.                           - Repeat colonoscopy in 5 years for surveillance.                           - Return to GI clinic in 4 months. Procedure Code(s):        --- Professional ---                           [phone], Colonoscopy, flexible; with removal of                            tumor(s), polyp(s), or other lesion(s) by snare                            technique                           [id number], 59, Colonoscopy, flexible;  with biopsy,                            single or multiple Diagnosis Code(s):        --- Professional ---                           K64.8, Other hemorrhoids                           K63.5, Polyp of colon                           K62.5, Hemorrhage of anus and rectum CPT copyright 2019 American Medical Association. All rights reserved. The codes documented in this report are preliminary and upon coder review may  be revised to meet current compliance requirements. Elon Alas. Abbey Chatters, DO [place] Abbey Chatters, DO 03/05/2022 11:13:10 AM This report has been signed electronically. Number of Addenda: 0

## 2022-03-05 NOTE — Anesthesia Postprocedure Evaluation (Signed)
Anesthesia Post Note  Patient: Jacobus Colvin  Procedure(s) Performed: COLONOSCOPY WITH PROPOFOL ESOPHAGOGASTRODUODENOSCOPY (EGD) WITH PROPOFOL BIOPSY POLYPECTOMY  Patient location during evaluation: Phase II Anesthesia Type: General Level of consciousness: awake Pain management: pain level controlled Vital Signs Assessment: post-procedure vital signs reviewed and stable Respiratory status: spontaneous breathing and respiratory function stable Cardiovascular status: blood pressure returned to baseline and stable Postop Assessment: no headache and no apparent nausea or vomiting Anesthetic complications: no Comments: Late entry   No notable events documented.   Last Vitals:  Vitals:   03/05/22 1114 03/05/22 1118  BP: 102/73 110/72  Pulse: 71   Resp: 15   Temp:    SpO2: 95% 96%    Last Pain:  Vitals:   03/05/22 1118  TempSrc:   PainSc: 0-No pain                 Louann Sjogren

## 2022-03-06 LAB — SURGICAL PATHOLOGY

## 2022-03-07 ENCOUNTER — Ambulatory Visit (INDEPENDENT_AMBULATORY_CARE_PROVIDER_SITE_OTHER): Payer: No Typology Code available for payment source

## 2022-03-07 DIAGNOSIS — J309 Allergic rhinitis, unspecified: Secondary | ICD-10-CM

## 2022-03-09 ENCOUNTER — Encounter (HOSPITAL_COMMUNITY): Payer: Self-pay | Admitting: Internal Medicine

## 2022-03-21 ENCOUNTER — Ambulatory Visit (INDEPENDENT_AMBULATORY_CARE_PROVIDER_SITE_OTHER): Payer: No Typology Code available for payment source

## 2022-03-21 DIAGNOSIS — J309 Allergic rhinitis, unspecified: Secondary | ICD-10-CM

## 2022-03-30 ENCOUNTER — Ambulatory Visit (INDEPENDENT_AMBULATORY_CARE_PROVIDER_SITE_OTHER): Payer: No Typology Code available for payment source

## 2022-03-30 DIAGNOSIS — J309 Allergic rhinitis, unspecified: Secondary | ICD-10-CM | POA: Diagnosis not present

## 2022-04-11 ENCOUNTER — Ambulatory Visit (INDEPENDENT_AMBULATORY_CARE_PROVIDER_SITE_OTHER): Payer: No Typology Code available for payment source

## 2022-04-11 DIAGNOSIS — J309 Allergic rhinitis, unspecified: Secondary | ICD-10-CM | POA: Diagnosis not present

## 2022-04-18 ENCOUNTER — Ambulatory Visit (INDEPENDENT_AMBULATORY_CARE_PROVIDER_SITE_OTHER): Payer: No Typology Code available for payment source | Admitting: *Deleted

## 2022-04-18 DIAGNOSIS — J309 Allergic rhinitis, unspecified: Secondary | ICD-10-CM | POA: Diagnosis not present

## 2022-05-02 ENCOUNTER — Ambulatory Visit (INDEPENDENT_AMBULATORY_CARE_PROVIDER_SITE_OTHER): Payer: No Typology Code available for payment source

## 2022-05-02 DIAGNOSIS — J309 Allergic rhinitis, unspecified: Secondary | ICD-10-CM | POA: Diagnosis not present

## 2022-05-09 ENCOUNTER — Ambulatory Visit (INDEPENDENT_AMBULATORY_CARE_PROVIDER_SITE_OTHER): Payer: No Typology Code available for payment source

## 2022-05-09 DIAGNOSIS — J309 Allergic rhinitis, unspecified: Secondary | ICD-10-CM

## 2022-05-16 ENCOUNTER — Other Ambulatory Visit (HOSPITAL_COMMUNITY): Payer: Self-pay | Admitting: Adult Health

## 2022-05-16 ENCOUNTER — Ambulatory Visit (HOSPITAL_COMMUNITY)
Admission: RE | Admit: 2022-05-16 | Discharge: 2022-05-16 | Disposition: A | Discharge status: Home / Self Care | Payer: PPO | Source: Ambulatory Visit | Attending: Adult Health | Admitting: Adult Health

## 2022-05-16 ENCOUNTER — Other Ambulatory Visit: Payer: Self-pay

## 2022-05-16 ENCOUNTER — Ambulatory Visit (INDEPENDENT_AMBULATORY_CARE_PROVIDER_SITE_OTHER): Payer: No Typology Code available for payment source | Admitting: Family

## 2022-05-16 ENCOUNTER — Encounter: Payer: Self-pay | Admitting: Family

## 2022-05-16 VITALS — BP 134/80 | HR 103 | Temp 98.0°F | Resp 18 | Ht 70.0 in | Wt 303.4 lb

## 2022-05-16 DIAGNOSIS — L299 Pruritus, unspecified: Secondary | ICD-10-CM

## 2022-05-16 DIAGNOSIS — M542 Cervicalgia: Secondary | ICD-10-CM | POA: Insufficient documentation

## 2022-05-16 DIAGNOSIS — J302 Other seasonal allergic rhinitis: Secondary | ICD-10-CM

## 2022-05-16 DIAGNOSIS — J3089 Other allergic rhinitis: Secondary | ICD-10-CM

## 2022-05-16 DIAGNOSIS — R0602 Shortness of breath: Secondary | ICD-10-CM

## 2022-05-16 MED ORDER — FLUTICASONE PROPIONATE 50 MCG/ACT NA SUSP: NASAL | 5 refills | Status: DC

## 2022-05-16 NOTE — Progress Notes (Signed)
[place], [address] [place] [address] Dept: [phone]  FOLLOW UP NOTE  Patient ID: Alexander Gutierrez, male    DOB: 09-01-1960  Age: 61 y.o. MRN: [id number] Date of Office Visit: 05/16/2022  Assessment  Chief Complaint: Allergic Rhinitis  (No issues ) and Pruritus (Some itching - not as bad as it usually can be )  HPI Alexander Gutierrez  Is a 61 year old male who presents today for follow-up of shortness of breath, seasonal and perennial allergic rhinitis with itching, and pruritus.  He was last seen on February 07, 2022 by myself.  He reports since his last office visit he has had 1 epidural for a pinched nerve.  He is also having a lumbar medial branch block soon and next week will have oral surgery.  Shortness of breath: He is currently not taking any medications for this.  Previously he had found that Spiriva and rescue inhalers were not effective.  He has not had any episodes of shortness of breath since his last office visit.  He also has not made an appointment to see pulmonology at the [facility] in [place], but plans to make this appointment.  He reports that he has had multiple full PFTs that were normal, but were not done when he was having symptoms.  He also mentions that another [place] pulmonologist did not think he had asthma. He denies cough, wheeze, tightness in chest, or shortness of breath. He reports that he has had a normal stress test and his echocardiogram from March 01, 2021 was normal. He also had a CT angiography chest in November 2021 showing: "IMPRESSION: 1. No evidence of pulmonary embolism. 2. Mild airways thickening, could reflect acute or chronic bronchitis versus reactive airways disease. 3. No other acute intrathoracic process. 4. Hepatic steatosis. 5. Mild bilateral perinephric stranding, nonspecific and possibly related to age or diminished renal function though can be seen in the setting of urinary tract infection. Correlate for urinary symptoms and  consider urinalysis as clinically warranted."  Seasonal and perennial allergic rhinitis: He stopped taking cetirizine 10 mg twice a day due to being diagnosed with stage IV liver cirrhosis.  He also does not use Flonase for this reason also.  He does use saline and it helps 30%.  He also receives allergy injections and denies any large local reactions.  He wonders if the allergy injections may be helping with the decrease in itching he has been having.  He reports that his allergy symptoms cause him to get a pain in his neck and then he will get clogged in his left ear canal and the neck region.  This sensation will occur 2-3 times a week. He will use Afrin sometimes for his left ear clogged sensation. He verbalizes the understanding that he should not use Afrin often. He also mentions that he blows his nose a lot, but is not running and not leaking.  He has not had any sinus infections since we last saw him.  Pruritus: He is currently not taking cetirizine twice a day due to his diagnosis of stage IV cirrhosis of the liver.  He does mention that the pruritus is less excruciating, less often in frequency, and less intense.  The pruritus does not occur every day.  He feels that the pruritus started after his chemical exposure from the [place].  When the pruritus first started it started on his left arm, neck, and jawline.  Then around 2006 or 2007 is stopped in the jaw region.  In 2010 he stopped having the itching in the neck.  Now itching occurs just in his bilateral lower forearms.  1 or 2 years ago he did have itching in the right leg, but none since then.  Drug Allergies:  No Known Allergies  Review of Systems: Review of Systems  Constitutional:  Negative for chills and fever.  HENT:         Reports that he gets neck pain and then will get clogged in his left ear canal and neck region.  He also has to blow his nose a lot, but it is not runny.  He denies nasal congestion and postnasal drip.  Eyes:         Denies itchy watery eyes  Respiratory:  Negative for cough, shortness of breath and wheezing.        Denies cough, wheeze, tightness in chest, shortness of breath, and nocturnal awakenings due to breathing problems  Cardiovascular:  Negative for chest pain and palpitations.  Gastrointestinal:        Reports that with previous endoscopic and colonoscopy he was told that he did have ulcers and was supposed to start pantoprazole.  He has not started this due to him having cirrhosis of the liver  Genitourinary:        Reports trouble initiating urination due to having enlarged prostate  Skin:        Reports previous deep intense inching is less intense and less frequent  Neurological:  Negative for headaches.  Endo/Heme/Allergies:  Positive for environmental allergies.     Physical Exam: BP 134/80   Pulse (!) 103   Temp 98 F (36.7 C)   Resp 18   Ht '5\' 10"'$  (1.778 m)   Wt (!) 303 lb 6.4 oz (137.6 kg)   SpO2 95%   BMI 43.53 kg/m    Physical Exam Constitutional:      Appearance: Normal appearance.  HENT:     Head: Normocephalic and atraumatic.     Comments: Pharynx normal, eyes normal, ears normal, nose normal    Right Ear: Tympanic membrane, ear canal and external ear normal.     Left Ear: Tympanic membrane, ear canal and external ear normal.     Nose: Nose normal.     Mouth/Throat:     Mouth: Mucous membranes are moist.     Pharynx: Oropharynx is clear.  Eyes:     Conjunctiva/sclera: Conjunctivae normal.  Cardiovascular:     Rate and Rhythm: Regular rhythm. Tachycardia present.     Heart sounds: Normal heart sounds.  Pulmonary:     Effort: Pulmonary effort is normal.     Breath sounds: Normal breath sounds.     Comments: Lungs clear to auscultation Musculoskeletal:     Cervical back: Neck supple.  Skin:    General: Skin is warm.  Neurological:     Mental Status: He is alert and oriented to person, place, and time.  Psychiatric:        Mood and Affect: Mood  normal.        Behavior: Behavior normal.        Thought Content: Thought content normal.        Judgment: Judgment normal.     Diagnostics:  FVC 2.91 L (63%), FEV1 2.40 L (68%). Predicted FVC 4.59 L, predicted FEV1 3.54 L. Spirometry indicates possible moderate restriction. Spirometry is consistent with previous spirometry.  Assessment and Plan: 1. SOB (shortness of breath)   2. Seasonal and perennial allergic rhinitis  3. Pruritus     Meds ordered this encounter  Medications   fluticasone (FLONASE) 50 MCG/ACT nasal spray    Sig: Place 1-2 sprays in each nostril once a day as needed for stuffy nose. In the right nostril, point the applicator out toward the right ear. In the left nostril, point the applicator out toward the left ear    Dispense:  16 g    Refill:  5    Patient Instructions  1. SOB (shortness of breath) December 08, 2021 chest x-ray showed no active cardiopulmonary disease - I am not convinced that this is asthma either. The next time you have symptoms call our office and schedule an appointment. We will do spirometry at that visit to see if you reverse then - Keep your appointment with pulmonary at the [facility] when this is scheduled.  2. Seasonal and perennial allergic rhinitis - with itching - Testing on 12/06/21 was positive to: grasses, ragweed, weeds, indoor molds, outdoor molds, dust mites, and cat. - Stop Zyrtec (cetirizine) 10 mg due to your cirrhosis - Start with: Flonase (fluticasone) one spray per nostril daily (AIM FOR EAR ON EACH SIDE). If this does not work we can consider trying Pulmicort (budesonide) with your sinus rinses - Consider nasal saline rinses 1-2 times daily to remove allergens from the nasal cavities as well as help with mucous clearance (this is especially helpful to do before the nasal sprays are given) -Continue allergy injections per protocol and have access to your epinephrine auto injector device on days that you get your allergy  injections  3. Pruritis (itching) - I am ok with not taking an antihistamine due to the intensity and frequency has decreased -Your cbc with diff and comprehensive metabolic panel from August and September of this year was unrevealing. I will order labs to check your thyroid. I will call you with results once they are all back  4. Schedule a follow up appointment in 3 months or sooner if needed            Reducing Pollen Exposure  The American Academy of Allergy, Asthma and Immunology suggests the following steps to reduce your exposure to pollen during allergy seasons.    Do not hang sheets or clothing out to dry; pollen may collect on these items. Do not mow lawns or spend time around freshly cut grass; mowing stirs up pollen. Keep windows closed at night.  Keep car windows closed while driving. Minimize morning activities outdoors, a time when pollen counts are usually at their highest. Stay indoors as much as possible when pollen counts or humidity is high and on windy days when pollen tends to remain in the air longer. Use air conditioning when possible.  Many air conditioners have filters that trap the pollen spores. Use a HEPA room air filter to remove pollen form the indoor air you breathe.  Control of Mold Allergen   Mold and fungi can grow on a variety of surfaces provided certain temperature and moisture conditions exist.  Outdoor molds grow on plants, decaying vegetation and soil.  The major outdoor mold, Alternaria and Cladosporium, are found in very high numbers during hot and dry conditions.  Generally, a late Summer - Fall peak is seen for common outdoor fungal spores.  Rain will temporarily lower outdoor mold spore count, but counts rise rapidly when the rainy period ends.  The most important indoor molds are Aspergillus and Penicillium.  Dark, humid and poorly ventilated basements are ideal sites  for mold growth.  The next most common sites of mold growth are the  bathroom and the kitchen.  Outdoor (Seasonal) Mold Control  Positive outdoor molds via skin testing: Alternaria, Cladosporium, Bipolaris (Helminthsporium), and Drechslera (Curvalaria)  Use air conditioning and keep windows closed Avoid exposure to decaying vegetation. Avoid leaf raking. Avoid grain handling. Consider wearing a face mask if working in moldy areas.    Indoor (Perennial) Mold Control   Positive indoor molds via skin testing: Aspergillus and Penicillium  Maintain humidity below 50%. Clean washable surfaces with 5% bleach solution. Remove sources e.g. contaminated carpets.    Control of Dust Mite Allergen    Dust mites play a major role in allergic asthma and rhinitis.  They occur in environments with high humidity wherever human skin is found.  Dust mites absorb humidity from the atmosphere (ie, they do not drink) and feed on organic matter (including shed human and animal skin).  Dust mites are a microscopic type of insect that you cannot see with the naked eye.  High levels of dust mites have been detected from mattresses, pillows, carpets, upholstered furniture, bed covers, clothes, soft toys and any woven material.  The principal allergen of the dust mite is found in its feces.  A gram of dust may contain 1,000 mites and 250,000 fecal particles.  Mite antigen is easily measured in the air during house cleaning activities.  Dust mites do not bite and do not cause harm to humans, other than by triggering allergies/asthma.    Ways to decrease your exposure to dust mites in your home:  Encase mattresses, box springs and pillows with a mite-impermeable barrier or cover   Wash sheets, blankets and drapes weekly in hot water (130 F) with detergent and dry them in a dryer on the hot setting.  Have the room cleaned frequently with a vacuum cleaner and a damp dust-mop.  For carpeting or rugs, vacuuming with a vacuum cleaner equipped with a high-efficiency particulate air  (HEPA) filter.  The dust mite allergic individual should not be in a room which is being cleaned and should wait 1 hour after cleaning before going into the room. Do not sleep on upholstered furniture (eg, couches).   If possible removing carpeting, upholstered furniture and drapery from the home is ideal.  Horizontal blinds should be eliminated in the rooms where the person spends the most time (bedroom, study, television room).  Washable vinyl, roller-type shades are optimal. Remove all non-washable stuffed toys from the bedroom.  Wash stuffed toys weekly like sheets and blankets above.   Reduce indoor humidity to less than 50%.  Inexpensive humidity monitors can be purchased at most hardware stores.  Do not use a humidifier as can make the problem worse and are not recommended.  Control of Dog or Cat Allergen  Avoidance is the best way to manage a dog or cat allergy. If you have a dog or cat and are allergic to dog or cats, consider removing the dog or cat from the home. If you have a dog or cat but don't want to find it a new home, or if your family wants a pet even though someone in the household is allergic, here are some strategies that may help keep symptoms at bay:  Keep the pet out of your bedroom and restrict it to only a few rooms. Be advised that keeping the dog or cat in only one room will not limit the allergens to that room. Don't pet, hug  or kiss the dog or cat; if you do, wash your hands with soap and water. High-efficiency particulate air (HEPA) cleaners run continuously in a bedroom or living room can reduce allergen levels over time. Regular use of a high-efficiency vacuum cleaner or a central vacuum can reduce allergen levels. Giving your dog or cat a bath at least once a week can reduce airborne allergen.      Return in about 3 months (around 08/16/2022), or if symptoms worsen or fail to improve.    Thank you for the opportunity to care for this patient.  Please do not  hesitate to contact me with questions.  Althea Charon, FNP [facility] [place] [facility] [place]

## 2022-05-16 NOTE — Patient Instructions (Addendum)
1. SOB (shortness of breath) December 08, 2021 chest x-ray showed no active cardiopulmonary disease - I am not convinced that this is asthma either. The next time you have symptoms call our office and schedule an appointment. We will do spirometry at that visit to see if you reverse then - Keep your appointment with pulmonary at the [facility] when this is scheduled.  2. Seasonal and perennial allergic rhinitis - with itching - Testing on 12/06/21 was positive to: grasses, ragweed, weeds, indoor molds, outdoor molds, dust mites, and cat. - Stop Zyrtec (cetirizine) 10 mg due to your cirrhosis - Start with: Flonase (fluticasone) one spray per nostril daily (AIM FOR EAR ON EACH SIDE). If this does not work we can consider trying Pulmicort (budesonide) with your sinus rinses - Consider nasal saline rinses 1-2 times daily to remove allergens from the nasal cavities as well as help with mucous clearance (this is especially helpful to do before the nasal sprays are given) -Continue allergy injections per protocol and have access to your epinephrine auto injector device on days that you get your allergy injections  3. Pruritis (itching) - I am ok with not taking an antihistamine due to the intensity and frequency has decreased -Your cbc with diff and comprehensive metabolic panel from August and September of this year was unrevealing. I will order labs to check your thyroid. I will call you with results once they are all back  4. Schedule a follow up appointment in 3 months or sooner if needed            Reducing Pollen Exposure  The American Academy of Allergy, Asthma and Immunology suggests the following steps to reduce your exposure to pollen during allergy seasons.    Do not hang sheets or clothing out to dry; pollen may collect on these items. Do not mow lawns or spend time around freshly cut grass; mowing stirs up pollen. Keep windows closed at night.  Keep car windows closed while  driving. Minimize morning activities outdoors, a time when pollen counts are usually at their highest. Stay indoors as much as possible when pollen counts or humidity is high and on windy days when pollen tends to remain in the air longer. Use air conditioning when possible.  Many air conditioners have filters that trap the pollen spores. Use a HEPA room air filter to remove pollen form the indoor air you breathe.  Control of Mold Allergen   Mold and fungi can grow on a variety of surfaces provided certain temperature and moisture conditions exist.  Outdoor molds grow on plants, decaying vegetation and soil.  The major outdoor mold, Alternaria and Cladosporium, are found in very high numbers during hot and dry conditions.  Generally, a late Summer - Fall peak is seen for common outdoor fungal spores.  Rain will temporarily lower outdoor mold spore count, but counts rise rapidly when the rainy period ends.  The most important indoor molds are Aspergillus and Penicillium.  Dark, humid and poorly ventilated basements are ideal sites for mold growth.  The next most common sites of mold growth are the bathroom and the kitchen.  Outdoor (Seasonal) Mold Control  Positive outdoor molds via skin testing: Alternaria, Cladosporium, Bipolaris (Helminthsporium), and Drechslera (Curvalaria)  Use air conditioning and keep windows closed Avoid exposure to decaying vegetation. Avoid leaf raking. Avoid grain handling. Consider wearing a face mask if working in moldy areas.    Indoor (Perennial) Mold Control   Positive indoor molds via skin testing:  Aspergillus and Penicillium  Maintain humidity below 50%. Clean washable surfaces with 5% bleach solution. Remove sources e.g. contaminated carpets.    Control of Dust Mite Allergen    Dust mites play a major role in allergic asthma and rhinitis.  They occur in environments with high humidity wherever human skin is found.  Dust mites absorb humidity from  the atmosphere (ie, they do not drink) and feed on organic matter (including shed human and animal skin).  Dust mites are a microscopic type of insect that you cannot see with the naked eye.  High levels of dust mites have been detected from mattresses, pillows, carpets, upholstered furniture, bed covers, clothes, soft toys and any woven material.  The principal allergen of the dust mite is found in its feces.  A gram of dust may contain 1,000 mites and 250,000 fecal particles.  Mite antigen is easily measured in the air during house cleaning activities.  Dust mites do not bite and do not cause harm to humans, other than by triggering allergies/asthma.    Ways to decrease your exposure to dust mites in your home:  Encase mattresses, box springs and pillows with a mite-impermeable barrier or cover   Wash sheets, blankets and drapes weekly in hot water (130 F) with detergent and dry them in a dryer on the hot setting.  Have the room cleaned frequently with a vacuum cleaner and a damp dust-mop.  For carpeting or rugs, vacuuming with a vacuum cleaner equipped with a high-efficiency particulate air (HEPA) filter.  The dust mite allergic individual should not be in a room which is being cleaned and should wait 1 hour after cleaning before going into the room. Do not sleep on upholstered furniture (eg, couches).   If possible removing carpeting, upholstered furniture and drapery from the home is ideal.  Horizontal blinds should be eliminated in the rooms where the person spends the most time (bedroom, study, television room).  Washable vinyl, roller-type shades are optimal. Remove all non-washable stuffed toys from the bedroom.  Wash stuffed toys weekly like sheets and blankets above.   Reduce indoor humidity to less than 50%.  Inexpensive humidity monitors can be purchased at most hardware stores.  Do not use a humidifier as can make the problem worse and are not recommended.  Control of Dog or Cat  Allergen  Avoidance is the best way to manage a dog or cat allergy. If you have a dog or cat and are allergic to dog or cats, consider removing the dog or cat from the home. If you have a dog or cat but don't want to find it a new home, or if your family wants a pet even though someone in the household is allergic, here are some strategies that may help keep symptoms at bay:  Keep the pet out of your bedroom and restrict it to only a few rooms. Be advised that keeping the dog or cat in only one room will not limit the allergens to that room. Don't pet, hug or kiss the dog or cat; if you do, wash your hands with soap and water. High-efficiency particulate air (HEPA) cleaners run continuously in a bedroom or living room can reduce allergen levels over time. Regular use of a high-efficiency vacuum cleaner or a central vacuum can reduce allergen levels. Giving your dog or cat a bath at least once a week can reduce airborne allergen.

## 2022-06-08 ENCOUNTER — Ambulatory Visit (INDEPENDENT_AMBULATORY_CARE_PROVIDER_SITE_OTHER): Payer: No Typology Code available for payment source

## 2022-06-08 ENCOUNTER — Encounter: Payer: Self-pay | Admitting: Gastroenterology

## 2022-06-08 ENCOUNTER — Ambulatory Visit (INDEPENDENT_AMBULATORY_CARE_PROVIDER_SITE_OTHER): Payer: PPO | Admitting: Gastroenterology

## 2022-06-08 VITALS — BP 129/81 | HR 70 | Temp 97.8°F | Ht 70.0 in | Wt 301.0 lb

## 2022-06-08 DIAGNOSIS — K7460 Unspecified cirrhosis of liver: Secondary | ICD-10-CM

## 2022-06-08 DIAGNOSIS — J309 Allergic rhinitis, unspecified: Secondary | ICD-10-CM

## 2022-06-08 NOTE — Progress Notes (Addendum)
GI Office Note    Referring Provider: No ref. provider found Primary Care Physician:  Hyler, Gwen Her, NP  Primary Gastroenterologist: Elon Alas. Abbey Chatters, DO   Chief Complaint   Chief Complaint  Patient presents with   Follow-up    Doing well.     History of Present Illness   Alexander Gutierrez is a 61 y.o. male presenting today for follow up of cirrhosis. Last seen in the office 01/2022. Numerous GI complaints at that time including nausea, dysphagia, rectal bleeding, melena, cirrhosis, bloating.   Patient states he was initially worked up by [facility] for his cirrhosis. He has since transferred his care to [facility] but never saw GI/hepatologist and plans to follow with [place]. He reports receiving two shot series for Hep B.   Patient states his liver work initially began after liver mass seen on imaging. States he had to have liver biopsy to rule out cancer. Biopsy returned with "stage IV cirrhosis" per patient. Patient states he is supposed to have MRI every 6 months.   Patient complains of intermittent nausea but no vomiting. He denies abdominal pain. No heartburn. BMs regular. No melena, brbpr. No heartburn. He states he has chronic back pain for which he is having nerve ablation in near future.   Previously requested [facility] records but none received. We have been able to find some in Care Everywhere outlined below.   Labs 01/2022: negative HCV Ab, normal LFTs, normal H/H and platelets.                 Jun 28, 2021 09:36 AM HEPATOLOGY NOTE: LOCAL TITLE: HEPATOLOGY-FIBROSCAN (LIVER ELASTOGRAPHY FINDINGS) STANDARD TITLE: HEPATOLOGY NOTE DATE OF NOTE: Jun 28, 2021@09$ :40 ENTRY DATE: Jun 28, 2021@09$ :36:24 AUTHOR: SHEN,NANCY HO EXP COSIGNER: URGENCY: STATUS: COMPLETED    HEPATOLOGY-FIBROSCAN (LIVER ELASTOGRAPHY FINDINGS) Has ADDENDA    FibroScan/VCTE Procedure Report Date/Time of Procedure: Date: June 28, 2021 Operator: Name: Dorna Leitz, NP Probe: Size: XL Time since  last meal: In hours: 3 BMI: 45.5 Liver Disease Etiology: Non-Alcoholic fatty liver disease (NASH)  How many total drinks have you had in the last 3 days prior to this procedure? Calculate total number of alcoholic beverage equivalents: 1.5 oz liquor = 4 oz wine = 12 oz beer Enter the TOTAL number of drinks consumed in the last 3 days Total drinks: 0  Procedure: After providing verbal explanation of the procedure to the patient, the patient was placed in a supine position with his/her right arm in maximum abduction to allow optimal exposure of right lateral abdomen. The patient was briefly assessed, identifying the terminus of the xyphoid process and finding an ideal testing location mid-line and lateral to this point. Shear wave pulses were applied and the resulting shear wave and propagation speed detected with the appropriate ultrasonic signal using the FibroScan probe. Skin to liver capsule distance and liver parenchyma were assessed during the examination with the FibroScan probe. The patient was instructed to breathe normally and to abstain from sudden movements during the procedure, allowing for random measurements of liver stiffness. Individual measurements of each shear wave were calculated. The patient tolerated the procedure well.  Results: Number of Valid Measurements: Numerical Value: 10 Number of invalid measurements: Numerical Value: 1  FibroScan Value (median, kPa): FibroScan Value: 9.8 FibroScan IQR: FibroScan IQR: 1.3 FibroScan IQR %: FibroScan IQR %: 13  Liver Steatosis: Controlled Attenuation Parameter CAP Score: 341 CAP IQR CAP IQR: 28  REPORT INTERPRETATION: Disease F0-F1 (Kpa) F2 (Kpa)  F3 (kpa) F4 (kpa) Hepatitis B </= 6.0 >/= 6.0 >/= 9.0 >/=12.0 Hepatitis C </= 7.0 >/= 7.0 >/= 9.5 >/= 12.0 HCV-HIV coinfection </= 7.0 </= 10 >/= 11.0 >/= 14.0 Cholestatic liver disease </= 7.0 >/= 7.5 >/= 10.0 >/= 17.0 NAFLD/NASH </= 7.0 >/= 7.5 </= 10 >/=  14.0   Taken from: Bonder, A. Afdhal, N. Utilization of FibroScan in Clinical Practice. Curr Gastroenterol Rep (2014) 16:372, DOI 10.1007/[phone] 196-7133-6  Procedure: - Provider verified that patient does NOT have any implantable electronic medical devices. - Patient provided verbal consent and agreement to perform the procedure.  Comments: - Patient tolerated the procedure well. - Ordering provider will be alerted of the results. - We recommend that the Fibroscan is performed in conjunction with an assessment of serologic markers of fibrosis such as FIB4 or APRI score. - Fibroscan results should be used in clinical context with an understanding of the limitations of this test. The Fibroscan does not stage fibrosis but rather stratifies risk and correlates well with fibrosis.   /es/ Dorna Leitz NP Signed: 06/28/2021 10:07  06/28/2021 ADDENDUM STATUS: COMPLETED c/f steatosis and no e/o cirrhosis based on todays exam.        Radiology Reports: +/- 30 days of the encounter Date/Time Radiology Report Provider Source  May 04, 2022 07:47 AM MRI ABDOMEN W & WO CONTRAST: RATANA, FLOOD [id number] DOB-JUL 06/14/1961 M Exm Date: May 04, 2022@07$ :48 Req Phys: Dorna Leitz HO Pat Loc: [id number] (Req'g Loc) Img Loc: MRI RADIOLOGY Service: Unknown    (Case [id number] COMPLETE)MRI ABDOMEN W & WO CONTRAST (MRI Detailed) [id number] Contrast Media : Gadolinium Proc Modifiers : Multihance Reason for Study: [place] surveillance  Clinical History:  Report Status: Verified Date Reported: May 07, 2022 Date Verified: May 07, 2022 Verifier E-Sig:/ES/SARAH [facility] CATER, M.D.  Report: MRI ABDOMEN WITH AND WITHOUT IV CONTRAST  TECHNIQUE: Precontrast and dynamic postcontrast MR imaging of the abdomen was performed using the Liver Protocol. IV contrast was administered to improve disease detection and further define anatomy. Contrast agent: 20 mL of Multihance  administered intravenously.  INDICATION: [place] surveillance. [place] LI-RADS 1C.  COMPARISON: Ultrasound dated Oct 23, 2021, CT dated April 18, 2021  FINDINGS:  Study is mildly limited due to motion artifact.  -LIVER: Marked diffuse hepatic steatosis. Focal fatty sparing about the gallbladder fossa. The liver contour is smooth without overt morphologic changes of cirrhosis.  Multiple highly T2 hyperintense lesions throughout the liver are compatible with cysts. Some may represent peribiliary cysts (series 201, image 31). Others appear to be conventional hepatic cysts, for example a 1.8 cm lobulated cyst in the left hepatic lobe on series 501, image 28. No abnormal associated enhancement.  -FOCAL HEPATIC OBSERVATIONS: No focal hepatic lesions that demonstrate arterial enhancement or delayed washout.  -HEPATIC VASCULAR ANATOMY: Patent portal and hepatic veins. Conventional hepatic arterial anatomy.  -BILIARY SYSTEM: Gallbladder is unremarkable. No intrahepatic or extrahepatic bile duct dilatation.  -COMPLICATIONS OF CHRONIC LIVER DISEASE: Spleen is upper normal in size, measuring up to 13 cm. No ascites. No upper abdominal varices.  LOWER CHEST: No suspicious masses, no pleural effusion.  PANCREAS, SPLEEN, ADRENALS: Normal appearance.  -GENITOURINARY: Kidneys are symmetric in size and enhancement. No hydronephrosis. No suspicious renal lesions.  -GI TRACT & PERITONEUM: Sigmoid diverticulosis.  -VASCULATURE & LYMPH NODES: No abdominal aortic aneurysm. No pathologic abdominal lymphadenopathy. Prominent porta hepatis lymph nodes are likely reactive.  -SOFT TISSUES: The visualized soft tissues are unremarkable. No aggressive appearing osseous lesions.    Impression:  Diffuse hepatic steatosis. No evidence of HCC.  NOTE: The LI-RADS / OPTN classification of liver lesions has been adopted to standardize CT and MRI scan reporting in patients at risk for hepatocellular  carcinoma. The imaging criteria for definite hepatocellular carcinoma are concordant for the LI-RADS and OPTN systems. LI-RADS criteria and documentation are available online at https://www.willis.org/. This report utilizes ACR LI-RADS v2018 for CT and MRI.  I have reviewed the imaging examination and agree with the findings and conclusions in this report.  Primary Interpreting Resident: Latanya Maudlin  Electronically Signed By: Lesly Dukes Electronically Signed On: 05/07/2022 1:46 PM  Primary Diagnostic Code: NO ALERT REQUIRED  Primary Interpreting Staff: Lisabeth Pick CATER, M.D., MD, ATTENDING RADIOLOGIST (Verifier) Primary Interpreting Resident: Romelle Starcher. [facility], MD, [place]      CT A/P with contrast 11/2021: IMPRESSION: Hepatic steatosis. No acute abnormality seen in the abdomen or pelvis.  EGD 02/2022: -reactive gastropathy with no h.pylori  Colonoscopy 02/2022: -nonbleeding internal hemorrhoids -two polyps removed -Tubular adenoma -next colonoscopy in 5 years  Medications   Current Outpatient Medications  Medication Sig Dispense Refill   albuterol (PROVENTIL) (2.5 MG/3ML) 0.083% nebulizer solution Take 2.5 mg by nebulization every 6 (six) hours as needed for wheezing or shortness of breath.     allopurinol (ZYLOPRIM) 100 MG tablet Take 100 mg by mouth daily.     atorvastatin (LIPITOR) 20 MG tablet Take 20 mg by mouth daily.     cetirizine (ZYRTEC) 10 MG tablet Take 1 tablet (10 mg total) by mouth 2 (two) times daily. (Patient taking differently: Take 10 mg by mouth daily.) 60 tablet 2   Cholecalciferol (VITAMIN D3) 1.25 MG (50000 UT) TABS Take by mouth.     cyanocobalamin (VITAMIN B12) 500 MCG tablet TAKE TWO TABLETS BY MOUTH DAILY FOR B12 SUPPLEMENT     EPINEPHrine 0.3 mg/0.3 mL IJ SOAJ injection Inject 0.3 mg into the muscle as needed for anaphylaxis. 1 each 1   fluticasone  (FLONASE) 50 MCG/ACT nasal spray Place 1-2 sprays in each nostril once a day as needed for stuffy nose. In the right nostril, point the applicator out toward the right ear. In the left nostril, point the applicator out toward the left ear 16 g 5   furosemide (LASIX) 20 MG tablet Take 20 mg by mouth 2 (two) times daily.     hydrocortisone (ANUSOL-HC) 2.5 % rectal cream Place 1 Application rectally 2 (two) times daily. (Patient taking differently: Place 1 Application rectally daily as needed for hemorrhoids or anal itching.) 30 g 1   ibuprofen (ADVIL) 600 MG tablet Take 600 mg by mouth every 4 (four) hours as needed for moderate pain. Given for after dental procedure 02/28/22     meloxicam (MOBIC) 7.5 MG tablet Take 1 tablet by mouth 2 (two) times daily.     pantoprazole (PROTONIX) 40 MG tablet Take 1 tablet (40 mg total) by mouth 2 (two) times daily before a meal. 60 tablet 11   propranolol (INDERAL) 10 MG tablet Take 10 mg by mouth daily.     sertraline (ZOLOFT) 100 MG tablet Take 100 mg by mouth daily.     tadalafil (CIALIS) 5 MG tablet Take 5 mg by mouth daily as needed for erectile dysfunction.     tamsulosin (FLOMAX) 0.4 MG CAPS capsule TAKE ONE CAPSULE BY MOUTH DAILY FOR PROSTATE     No current facility-administered medications for this visit.    Allergies   Allergies as  of 06/08/2022   (No Known Allergies)       Review of Systems   General: Negative for anorexia, weight loss, fever, chills, fatigue, weakness. ENT: Negative for hoarseness, difficulty swallowing , nasal congestion. CV: Negative for chest pain, angina, palpitations, dyspnea on exertion, peripheral edema.  Respiratory: Negative for dyspnea at rest, dyspnea on exertion, cough, sputum, wheezing.  GI: See history of present illness. GU:  Negative for dysuria, hematuria, urinary incontinence, urinary frequency, nocturnal urination.  Endo: Negative for unusual weight change.     Physical Exam   BP 129/81 (BP Location:  Right Arm, Patient Position: Sitting, Cuff Size: Large)   Pulse 70   Temp 97.8 F (36.6 C) (Oral)   Ht 5' 10"$  (1.778 m)   Wt (!) 301 lb (136.5 kg)   SpO2 94%   BMI 43.19 kg/m    General: obese, Well-nourished, well-developed in no acute distress.  Eyes: No icterus. Mouth: Oropharyngeal mucosa moist and pink , no lesions erythema or exudate. Lungs: Clear to auscultation bilaterally.  Heart: Regular rate and rhythm, no murmurs rubs or gallops.  Abdomen: Bowel sounds are normal, nontender, nondistended, no hepatosplenomegaly or masses, no abdominal bruits or hernia , no rebound or guarding. Rectus diastasis. Rectal: not performed Extremities: No lower extremity edema. No clubbing or deformities. Neuro: Alert and oriented x 4   Skin: Warm and dry, no jaundice.   Psych: Alert and cooperative, normal mood and affect.  Labs   N/a  Imaging Studies   DG Cervical Spine 2 or 3 views  Result Date: 05/17/2022 CLINICAL DATA:  Neck pain EXAM: CERVICAL SPINE - 2-3 VIEW COMPARISON:  None Available. FINDINGS: There is no evidence of cervical spine fracture or prevertebral soft tissue swelling. Minimal curvature of spine noted. Mild degenerative joint changes of C5-6 with narrowed joint space and anterior spurring are noted. IMPRESSION: Mild degenerative joint changes of C5-6. Electronically Signed   By: Abelardo Diesel M.D.   On: 05/17/2022 10:55    Assessment   Cirrhosis: reported per patient. 06/2021, Elastography with median kPa of 9.8. 04/2022, MRI liver without overt cirrhosis changes. Need to locate liver biopsy results for further review. At this time it is not clear regarding patient's reports of liver biopsy findings. MRI with cysts but no lesions concerning for HCC. Labs in 01/2022 with normal AFP, LFTs, platelets.    PLAN   We have requested records again. Patient also to look for records and forward.  Instructions for fatty liver provided.  Return ov in six months.    Laureen Ochs.  Bobby Rumpf, [place], [facility]

## 2022-06-08 NOTE — Patient Instructions (Addendum)
Please send any liver labs, imaging, or notes via Mychart for Alexander Gutierrez to review. We will request records as well. Continue to be as active as possible, walk 20 minutes every day, work on weight loss. Avoid alcohol due to your liver scarring.  I will review all of your medications to make sure no changes are needed due to the liver. We will contact you if necessary. Return office visit in six months.    Fatty Liver Disease  The liver converts food into energy, removes toxic material from the blood, makes important proteins, and absorbs necessary vitamins from food. Fatty liver disease occurs when too much fat has built up in your liver cells. Fatty liver disease is also called hepatic steatosis. In many cases, fatty liver disease does not cause symptoms or problems. It is often diagnosed when tests are being done for other reasons. However, over time, fatty liver can cause inflammation that may lead to more serious liver problems, such as scarring of the liver (cirrhosis) and liver failure. Fatty liver is associated with insulin resistance, increased body fat, high blood pressure (hypertension), and high cholesterol. These are features of metabolic syndrome and increase your risk for stroke, diabetes, and heart disease. What are the causes? This condition may be caused by components of metabolic syndrome: Obesity. Insulin resistance. High cholesterol. Other causes: Alcohol abuse. Poor nutrition. Cushing syndrome. Pregnancy. Certain drugs. Poisons. Some viral infections. What increases the risk? You are more likely to develop this condition if you: Abuse alcohol. Are overweight. Have diabetes. Have hepatitis. Have a high triglyceride level. Are pregnant. What are the signs or symptoms? Fatty liver disease often does not cause symptoms. If symptoms do develop, they can include: Fatigue and weakness. Weight loss. Confusion. Nausea, vomiting, or abdominal pain. Yellowing of your skin  and the white parts of your eyes (jaundice). Itchy skin. How is this diagnosed? This condition may be diagnosed by: A physical exam and your medical history. Blood tests. Imaging tests, such as an ultrasound, CT scan, or MRI. A liver biopsy. A small sample of liver tissue is removed using a needle. The sample is then looked at under a microscope. How is this treated? Fatty liver disease is often caused by other health conditions. Treatment for fatty liver may involve medicines and lifestyle changes to manage conditions such as: Alcoholism. High cholesterol. Diabetes. Being overweight or obese. Follow these instructions at home:  Do not drink alcohol. If you have trouble quitting, ask your health care provider how to safely quit with the help of medicine or a supervised program. This is important to keep your condition from getting worse. Eat a healthy diet as told by your health care provider. Ask your health care provider about working with a dietitian to develop an eating plan. Exercise regularly. This can help you lose weight and control your cholesterol and diabetes. Talk to your health care provider about an exercise plan and which activities are best for you. Take over-the-counter and prescription medicines only as told by your health care provider. Keep all follow-up visits. This is important. Contact a health care provider if: You have trouble controlling your: Blood sugar. This is especially important if you have diabetes. Cholesterol. Drinking of alcohol. Get help right away if: You have abdominal pain. You have jaundice. You have nausea and are vomiting. You vomit blood or material that looks like coffee grounds. You have stools that are black, tar-like, or bloody. Summary Fatty liver disease develops when too much fat builds up  in the cells of your liver. Fatty liver disease often causes no symptoms or problems. However, over time, fatty liver can cause inflammation that  may lead to more serious liver problems, such as scarring of the liver (cirrhosis). You are more likely to develop this condition if you abuse alcohol, are pregnant, are overweight, have diabetes, have hepatitis, or have high triglyceride or cholesterol levels. Contact your health care provider if you have trouble controlling your blood sugar, cholesterol, or drinking of alcohol. This information is not intended to replace advice given to you by your health care provider. Make sure you discuss any questions you have with your health care provider. Document Revised: 03/17/2020 Document Reviewed: 03/17/2020 Elsevier Patient Education  [place].

## 2022-06-13 ENCOUNTER — Ambulatory Visit (INDEPENDENT_AMBULATORY_CARE_PROVIDER_SITE_OTHER): Payer: No Typology Code available for payment source

## 2022-06-13 DIAGNOSIS — J309 Allergic rhinitis, unspecified: Secondary | ICD-10-CM

## 2022-06-20 ENCOUNTER — Ambulatory Visit (INDEPENDENT_AMBULATORY_CARE_PROVIDER_SITE_OTHER): Payer: No Typology Code available for payment source

## 2022-06-20 DIAGNOSIS — J309 Allergic rhinitis, unspecified: Secondary | ICD-10-CM | POA: Diagnosis not present

## 2022-06-27 ENCOUNTER — Ambulatory Visit (INDEPENDENT_AMBULATORY_CARE_PROVIDER_SITE_OTHER): Payer: No Typology Code available for payment source

## 2022-06-27 DIAGNOSIS — J309 Allergic rhinitis, unspecified: Secondary | ICD-10-CM | POA: Diagnosis not present

## 2022-07-04 ENCOUNTER — Ambulatory Visit (INDEPENDENT_AMBULATORY_CARE_PROVIDER_SITE_OTHER): Payer: No Typology Code available for payment source

## 2022-07-04 DIAGNOSIS — J309 Allergic rhinitis, unspecified: Secondary | ICD-10-CM

## 2022-07-11 ENCOUNTER — Ambulatory Visit (INDEPENDENT_AMBULATORY_CARE_PROVIDER_SITE_OTHER): Payer: No Typology Code available for payment source

## 2022-07-11 DIAGNOSIS — J309 Allergic rhinitis, unspecified: Secondary | ICD-10-CM

## 2022-07-18 ENCOUNTER — Ambulatory Visit (INDEPENDENT_AMBULATORY_CARE_PROVIDER_SITE_OTHER): Payer: No Typology Code available for payment source

## 2022-07-18 DIAGNOSIS — J309 Allergic rhinitis, unspecified: Secondary | ICD-10-CM | POA: Diagnosis not present

## 2022-07-30 ENCOUNTER — Telehealth: Payer: Self-pay | Admitting: Gastroenterology

## 2022-07-30 NOTE — Telephone Encounter (Signed)
Reviewed records received from the [place].  CT-guided liver biopsy completed October 05, 2021.  Indication for biopsy noted to be hepatic steatosis, concern for fibrosis.  Pathology revealed steatosis with mild necroinflammatory activity and fragmented liver parenchyma.  "Biopsy contains thin fragments of liver parenchyma with steatosis and very mild portal/periportal and parenchymal inflammation.  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  These changes are suggestive of steatohepatitis but are not well-established.  CK7 decorates portal ducts, ductules and rare cholestatic hepatocyte.  Mason trichrome highlights portal areas with early periportal fibrosis and very focal pericellular fibrosis.  Central and/or portal veins are not well-visualized-this could be due to sampling error since many of the portal areas are on tissue edge.  Labs from October 2021 TTG IgG less than 1, TTG IgA less than 1, hepatitis C antibody negative, hepatitis B surface antigen negative, ANA screen negative, IgA 121, IgG 613, IgM 53, hepatitis a IgG positive, hepatitis B core antibody negative, hepatitis B surface antibody negative,, FIB-4 0.84  Tammy, Please let pt know that I reviewed labs and liver biopsy retrieved from the [place]. I also saw his MRI from 04/2022 that showed marked hepatic steatosis but liver smooth and no overt changes of cirrhosis. Liver biopsy with steatohepatitis but no cirrhosis. No worrisome liver lesions on MRI.   His median kPa score was 9.8 so he may have F3 (advanced fibrosis).  [facility], as you would for cirrhosis.   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 I would like for his next follow up to be with Dr. Abbey Chatters. He may consider to follow his as he has cirrhosis we will see what he advises.   MANDY, PLEASE NIC FOR OV WITH CARVER 11/2022.

## 2022-07-31 NOTE — Telephone Encounter (Signed)
Pt was made aware and verbalized understanding.  

## 2022-08-15 ENCOUNTER — Ambulatory Visit (INDEPENDENT_AMBULATORY_CARE_PROVIDER_SITE_OTHER): Payer: No Typology Code available for payment source | Admitting: Allergy & Immunology

## 2022-08-15 ENCOUNTER — Other Ambulatory Visit: Payer: Self-pay

## 2022-08-15 ENCOUNTER — Other Ambulatory Visit: Payer: Self-pay | Admitting: Allergy & Immunology

## 2022-08-15 ENCOUNTER — Encounter: Payer: Self-pay | Admitting: Allergy & Immunology

## 2022-08-15 VITALS — BP 110/66 | HR 86 | Temp 98.3°F | Resp 16 | Wt 300.5 lb

## 2022-08-15 DIAGNOSIS — J309 Allergic rhinitis, unspecified: Secondary | ICD-10-CM | POA: Diagnosis not present

## 2022-08-15 DIAGNOSIS — R0602 Shortness of breath: Secondary | ICD-10-CM

## 2022-08-15 DIAGNOSIS — L299 Pruritus, unspecified: Secondary | ICD-10-CM

## 2022-08-15 DIAGNOSIS — J302 Other seasonal allergic rhinitis: Secondary | ICD-10-CM

## 2022-08-15 MED ORDER — FLUTICASONE PROPIONATE 50 MCG/ACT NA SUSP: NASAL | 5 refills | Status: DC

## 2022-08-15 MED ORDER — AZELASTINE HCL 0.15 % NA SOLN: NASAL | 5 refills | Status: DC

## 2022-08-15 MED ORDER — CETIRIZINE HCL 10 MG PO TABS: 10.0000 mg | ORAL_TABLET | Freq: Two times a day (BID) | ORAL | 5 refills | Status: DC

## 2022-08-15 NOTE — Progress Notes (Signed)
FOLLOW UP  Date of Service/Encounter:  08/15/22   Assessment:   SOB (shortness of breath) - did not do spiro today   OSA - on CPAP (followed by Dr. Halford Chessman)   Seasonal and perennial allergic rhinitis (grasses, ragweed, weeds, indoor molds, outdoor molds, dust mites, and cat) - doing better on allergen immunotherapy, but not up to maintenance   Chronic back pain   PTSD  Plan/Recommendations:   1. SOB (shortness of breath) - with normal chest X-ray  - It seems that you are doing well with the Lung Clear as needed.   2. Seasonal and perennial allergic rhinitis - with itching - Previous testing showed: grasses, ragweed, weeds, indoor molds, outdoor molds, dust mites, and cat. - Continue with: Flonase (fluticasone) one spray per nostril daily (AIM FOR EAR ON EACH SIDE).  - Consider nasal saline rinses 1-2 times daily to remove allergens from the nasal cavities as well as help with mucous clearance (this is especially helpful to do before the nasal sprays are given) -Continue allergy injections per protocol and have access to your epinephrine auto injector device on days that you get your allergy injections - OK to come twice weekly if you are interested (we are open on M/W/F).   3. Pruritis (itching) - Improved with the allergy shots.  - You seem to have a good handle on this.   4. Return in about 6 months (around 02/13/2023).    Subjective:   Alexander Gutierrez is a 62 y.o. male presenting today for follow up of  Chief Complaint  Patient presents with   Follow-up    SOB came back in Jan.    Pruritus    Still itching on left arm but has been less now.     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  [name] is a 62 y.o. male presenting for a follow up visit.  He was last seen in November 2023.  At that time, he was having shortness of breath.  He was already set up to see pulmonology at the [facility].  Spirometry showed a restrictive pattern.  For his rhinitis, he was started on Flonase.  Zyrtec was stopped.  He was continued on allergy shots.  For his pruritus, lab testing was unrevealing.  Since last visit, he has done well. He went to [place]. They are moving there later this year. They are making their dream home out there. They are thinking that they are going to move there by the end of the year. He originally moved here because of his 2nd wife who he divorced. Apparently his children from that marriage want nothing to do with him (he has three children from that marriage who all live fairly close (within two hours), but he does not have any contact with them at all.   Asthma/Respiratory Symptom History: SOB goes away 6 months out of the year. It went  away last summer and then returned. He did have a liquidy chest thing going on and this resolved a month ago. He did get an eye dropper called "Lung Clear" that seemed to improve.     Allergic Rhinitis Symptom History: His allergic rhinitis symptoms are under fair control. He had to miss a couple of weeks since he was out of town, but  he is going to work on getting back on track. He does hope that this will help with his symptoms.   Alexander Gutierrez is on allergen immunotherapy. He receives two injections. Immunotherapy script #1 contains molds and dust mites. He currently receives 0.50m of the BLUE vial (1/100,000). Immunotherapy script #2  contains  ragweed, weeds, grasses, and cat. He currently receives 0.370mof the BLUE vial (1/100,000). He started shots July of 2023 and not yet reached maintenance.   Otherwise, there have been no changes to his past medical history, surgical history, family history, or social history.    Review of Systems  Constitutional: Negative.  Negative for chills, fever, malaise/fatigue and weight loss.  HENT: Negative.  Negative for congestion, ear discharge and ear pain.   Eyes:  Negative for pain, discharge and redness.  Respiratory:  Negative for cough, sputum production, shortness of breath and wheezing.   Cardiovascular: Negative.  Negative for chest pain and palpitations.  Gastrointestinal:  Negative for abdominal pain, constipation, diarrhea, heartburn, nausea and vomiting.  Skin: Negative.  Negative for itching and rash.  Neurological:  Negative for dizziness and headaches.  Endo/Heme/Allergies:  Negative for environmental allergies. Does not bruise/bleed easily.       Objective:   Blood pressure 110/66, pulse 86, temperature 98.3 F (36.8 C), resp. rate 16, weight (!) 300 lb 8 oz (136.3 kg), SpO2 98 %. Body mass index is 43.12 kg/m.    Physical Exam Vitals reviewed.  Constitutional:      Appearance: He is well-developed.  HENT:     Head: Normocephalic and atraumatic.     Right Ear: Tympanic membrane, ear canal and external ear normal. No drainage, swelling or tenderness. Tympanic membrane is not injected, scarred, erythematous, retracted or bulging.     Left Ear: Tympanic membrane, ear canal and external ear normal. No drainage, swelling or tenderness. Tympanic membrane is not injected, scarred, erythematous, retracted or bulging.     Nose: No nasal deformity, septal deviation, mucosal edema or rhinorrhea.     Right Turbinates: Enlarged, swollen and pale.     Left Turbinates: Enlarged, swollen and pale.     Right Sinus: No maxillary sinus tenderness or frontal sinus  tenderness.     Left Sinus: No maxillary sinus tenderness or frontal sinus tenderness.     Comments: No nasal polyps noted.     Mouth/Throat:     Mouth: Mucous membranes are not pale and not dry.     Pharynx: Uvula midline.  Eyes:     General:        Right eye: No discharge.        Left eye: No discharge.     Conjunctiva/sclera: Conjunctivae normal.     Right eye: Right conjunctiva is not injected. No chemosis.    Left eye: Left conjunctiva is not injected. No chemosis.    Pupils: Pupils are equal, round, and reactive to light.  Cardiovascular:     Rate and Rhythm: Normal rate and regular rhythm.     Heart sounds: Normal heart sounds.  Pulmonary:     Effort: Pulmonary effort is normal. No  tachypnea, accessory muscle usage or respiratory distress.     Breath sounds: Normal breath sounds. No wheezing, rhonchi or rales.  Chest:     Chest wall: No tenderness.  Abdominal:     Tenderness: There is no abdominal tenderness. There is no guarding or rebound.  Lymphadenopathy:     Head:     Right side of head: No submandibular, tonsillar or occipital adenopathy.     Left side of head: No submandibular, tonsillar or occipital adenopathy.     Cervical: No cervical adenopathy.  Skin:    Coloration: Skin is not pale.     Findings: No abrasion, erythema, petechiae or rash. Rash is not papular, urticarial or vesicular.  Neurological:     Mental Status: He is alert.  Psychiatric:        Behavior: Behavior is cooperative.      Diagnostic studies: none      Salvatore Marvel, MD  [facility]

## 2022-08-15 NOTE — Patient Instructions (Addendum)
1. SOB (shortness of breath) - with normal chest X-ray  - It seems that you are doing well with the Lung Clear as needed.   2. Seasonal and perennial allergic rhinitis - with itching - Previous testing showed: grasses, ragweed, weeds, indoor molds, outdoor molds, dust mites, and cat. - Continue with: Flonase (fluticasone) one spray per nostril daily (AIM FOR EAR ON EACH SIDE).  - Consider nasal saline rinses 1-2 times daily to remove allergens from the nasal cavities as well as help with mucous clearance (this is especially helpful to do before the nasal sprays are given) -Continue allergy injections per protocol and have access to your epinephrine auto injector device on days that you get your allergy injections - OK to come twice weekly if you are interested (we are open on M/W/F).   3. Pruritis (itching) - Improved with the allergy shots.  - You seem to have a good handle on this.   4. Return in about 6 months (around 02/13/2023).    Please inform us of any Emergency Department visits, hospitalizations, or changes in symptoms. Call us before going to the ED for breathing or allergy symptoms since we might be able to fit you in for a sick visit. Feel free to contact us anytime with any questions, problems, or concerns.  It was a pleasure to see you again today!  Websites that have reliable patient information: 1. American Academy of Asthma, Allergy, and Immunology: www.aaaai.org 2. Food Allergy Research and Education (FARE): foodallergy.org 3. Mothers of Asthmatics: http://www.asthmacommunitynetwork.org 4. American College of Allergy, Asthma, and Immunology: www.acaai.org   COVID-19 Vaccine Information can be found at: [email or web address] For questions related to vaccine distribution or appointments, please email [email or web address] or call [phone].   We realize that you might be concerned about having an allergic  reaction to the COVID19 vaccines. To help with that concern, WE ARE OFFERING THE COVID19 VACCINES IN OUR OFFICE! Ask the front desk for dates!     "Like" [place] on Facebook and Instagram for our latest updates!      A healthy democracy works best when [employer] participate! Make sure you are registered to vote! If you have moved or changed any of your contact information, you will need to get this updated before voting!  In some cases, you MAY be able to register to vote online: [email or web address]

## 2022-08-29 ENCOUNTER — Ambulatory Visit (INDEPENDENT_AMBULATORY_CARE_PROVIDER_SITE_OTHER): Payer: No Typology Code available for payment source

## 2022-08-29 DIAGNOSIS — J309 Allergic rhinitis, unspecified: Secondary | ICD-10-CM

## 2022-09-12 ENCOUNTER — Other Ambulatory Visit: Payer: Self-pay

## 2022-09-12 ENCOUNTER — Ambulatory Visit (INDEPENDENT_AMBULATORY_CARE_PROVIDER_SITE_OTHER): Payer: No Typology Code available for payment source

## 2022-09-12 DIAGNOSIS — J309 Allergic rhinitis, unspecified: Secondary | ICD-10-CM | POA: Diagnosis not present

## 2022-09-13 ENCOUNTER — Other Ambulatory Visit: Payer: Self-pay

## 2022-09-13 MED ORDER — AZELASTINE HCL 0.1 % NA SOLN: 2.0000 | Freq: Two times a day (BID) | NASAL | 2 refills | Status: DC

## 2022-09-19 ENCOUNTER — Ambulatory Visit (INDEPENDENT_AMBULATORY_CARE_PROVIDER_SITE_OTHER): Payer: No Typology Code available for payment source

## 2022-09-19 DIAGNOSIS — J309 Allergic rhinitis, unspecified: Secondary | ICD-10-CM | POA: Diagnosis not present

## 2022-09-21 ENCOUNTER — Ambulatory Visit (INDEPENDENT_AMBULATORY_CARE_PROVIDER_SITE_OTHER): Payer: No Typology Code available for payment source

## 2022-09-21 DIAGNOSIS — J309 Allergic rhinitis, unspecified: Secondary | ICD-10-CM | POA: Diagnosis not present

## 2022-09-25 ENCOUNTER — Other Ambulatory Visit: Payer: Self-pay | Admitting: *Deleted

## 2022-09-25 MED ORDER — CETIRIZINE HCL 10 MG PO TABS: 10.0000 mg | ORAL_TABLET | Freq: Two times a day (BID) | ORAL | 1 refills | Status: DC

## 2022-09-28 ENCOUNTER — Ambulatory Visit (INDEPENDENT_AMBULATORY_CARE_PROVIDER_SITE_OTHER): Payer: No Typology Code available for payment source

## 2022-09-28 DIAGNOSIS — J309 Allergic rhinitis, unspecified: Secondary | ICD-10-CM | POA: Diagnosis not present

## 2022-10-03 ENCOUNTER — Ambulatory Visit (INDEPENDENT_AMBULATORY_CARE_PROVIDER_SITE_OTHER): Payer: No Typology Code available for payment source

## 2022-10-03 DIAGNOSIS — J309 Allergic rhinitis, unspecified: Secondary | ICD-10-CM | POA: Diagnosis not present

## 2022-10-10 ENCOUNTER — Ambulatory Visit (INDEPENDENT_AMBULATORY_CARE_PROVIDER_SITE_OTHER): Payer: No Typology Code available for payment source

## 2022-10-10 DIAGNOSIS — J309 Allergic rhinitis, unspecified: Secondary | ICD-10-CM

## 2022-10-12 ENCOUNTER — Ambulatory Visit (INDEPENDENT_AMBULATORY_CARE_PROVIDER_SITE_OTHER): Payer: No Typology Code available for payment source

## 2022-10-12 DIAGNOSIS — J309 Allergic rhinitis, unspecified: Secondary | ICD-10-CM

## 2022-10-15 ENCOUNTER — Encounter: Payer: Self-pay | Admitting: Pulmonary Disease

## 2022-10-15 ENCOUNTER — Ambulatory Visit (INDEPENDENT_AMBULATORY_CARE_PROVIDER_SITE_OTHER): Payer: Medicare PPO | Admitting: Pulmonary Disease

## 2022-10-15 VITALS — BP 115/73 | HR 89 | Ht 70.0 in | Wt 306.6 lb

## 2022-10-15 DIAGNOSIS — G4733 Obstructive sleep apnea (adult) (pediatric): Secondary | ICD-10-CM | POA: Diagnosis not present

## 2022-10-15 DIAGNOSIS — G4730 Sleep apnea, unspecified: Secondary | ICD-10-CM | POA: Diagnosis not present

## 2022-10-15 DIAGNOSIS — E669 Obesity, unspecified: Secondary | ICD-10-CM | POA: Diagnosis not present

## 2022-10-15 DIAGNOSIS — M542 Cervicalgia: Secondary | ICD-10-CM

## 2022-10-15 NOTE — Patient Instructions (Signed)
Call with the name of your CPAP machine.  You can look up cervical neck pillows on-line.  Follow up in 1 year.

## 2022-10-15 NOTE — Progress Notes (Signed)
[facility]  Chief Complaint  Patient presents with   Follow-up    Constitutional:  BP 115/73   Pulse 89   Ht 5\' 10"  (1.778 m)   Wt (!) 306 lb 9.6 oz (139.1 kg)   SpO2 93%   BMI 43.99 kg/m   Past Medical History:  Chronic back pain, Chronic fatigue, PTSD, GERD, Gout, HLD, ED, Depression, ADHD, Vocal cord dysfunction  Past Surgical History:  He  has a past surgical history that includes Laryngoscopy; Colonoscopy; Colonoscopy with propofol (N/A, 03/05/2022); Esophagogastroduodenoscopy (egd) with propofol (N/A, 03/05/2022); biopsy (03/05/2022); and polypectomy (03/05/2022).  Brief Summary:  Alexander Gutierrez is a 62 y.o. male veteran with obstructive sleep apnea and upper airway cough syndrome.  He uses the [place].      Subjective:   He gets supplies from the [place].  He isn't sure what type of machine he has now that his Respironics was replaced.  No issue with mask fit.    He has been getting allergy shots since last Summer and breathing is much better now.  Had occipital nerve ablation due to persistent headache with neck pain.  He sleeps on his back and side.  Physical Exam:   Appearance - well kempt   ENMT - no sinus tenderness, no oral exudate, no LAN, Mallampati 3 airway, no stridor  Respiratory - equal breath sounds bilaterally, no wheezing or rales  CV - s1s2 regular rate and rhythm, no murmurs  Ext - no clubbing, no edema  Skin - no rashes  Psych - normal mood and affect     Pulmonary testing:  IgE 01/02/21 >> 40  Chest Imaging:  CT angio chest 05/16/20 >> diffuse airway thickening, fatty liver  Sleep Tests:  HST 06/17/21 >> AHI 59, SpO2 low 76%  Cardiac Tests:  Echo 03/01/21 >> EF 55 to 60%, mild LVH  Social History:  He  reports that he has never smoked. He has never used smokeless tobacco. He reports that he does not drink alcohol and does not use drugs.  Family History:  His family history is not  on file.     Assessment/Plan:   Obstructive sleep apnea. - he is compliant with CPAP and reports benefit from therapy - gets supplies through the [place] - he will call with the brand of his CPAP machine and will then coordinate how to get a download  Cervical neck pain with persistent headache. - discussed different neck pillow options he can explore  Upper airway cough with vocal cord dysfunction. Seasonal and Perennial allergic rhinitis. - followed by Dr. Malachi Bonds - allergies to grass, ragweed, weed, mold, dust mite and cat - on allergen immunotherayp - had toxic fume exposure in [place] in [place]  Obesity. - he is aware of how his weight can impact his health, particularly in relation to sleep apnea  PTSD, Depression. - [facility]  Time Spent Involved in Patient Care on Day of Examination:  26 minutes  Follow up:   Patient Instructions  Call with the name of your CPAP machine.  You can look up cervical neck pillows on-line.  Follow up in 1 year.  Medication List:   Allergies as of 10/15/2022   No Known Allergies      Medication List        Accurate as of October 15, 2022  8:57 AM. If you have any questions, ask your nurse or doctor.  albuterol (2.5 MG/3ML) 0.083% nebulizer solution Commonly known as: PROVENTIL Take 2.5 mg by nebulization every 6 (six) hours as needed for wheezing or shortness of breath.   allopurinol 100 MG tablet Commonly known as: ZYLOPRIM Take 100 mg by mouth daily.   atorvastatin 20 MG tablet Commonly known as: LIPITOR Take 20 mg by mouth daily.   azelastine 0.1 % nasal spray Commonly known as: ASTELIN Place 2 sprays into both nostrils 2 (two) times daily.   cetirizine 10 MG tablet Commonly known as: ZYRTEC Take 1 tablet (10 mg total) by mouth 2 (two) times daily.   clotrimazole 1 % cream Commonly known as: LOTRIMIN APPLY SMALL AMOUNT TO AFFECTED AREA DAILY FOR FEET    cyanocobalamin 500 MCG tablet Commonly known as: VITAMIN B12 TAKE TWO TABLETS BY MOUTH DAILY FOR B12 SUPPLEMENT   diclofenac Sodium 1 % Gel Commonly known as: VOLTAREN APPLY 2 GRAMS TO AFFECTED AREA FOUR TIMES A DAY AS NEEDED FOR PAIN   EPINEPHrine 0.3 mg/0.3 mL Soaj injection Commonly known as: EPI-PEN Inject 0.3 mg into the muscle as needed for anaphylaxis.   fluticasone 50 MCG/ACT nasal spray Commonly known as: FLONASE Place 1-2 sprays in each nostril once a day as needed for stuffy nose. In the right nostril, point the applicator out toward the right ear. In the left nostril, point the applicator out toward the left ear   furosemide 20 MG tablet Commonly known as: LASIX Take 20 mg by mouth 2 (two) times daily.   ibuprofen 600 MG tablet Commonly known as: ADVIL Take 600 mg by mouth every 4 (four) hours as needed for moderate pain. Given for after dental procedure 02/28/22   metFORMIN 500 MG tablet Commonly known as: GLUCOPHAGE   pantoprazole 40 MG tablet Commonly known as: Protonix Take 1 tablet (40 mg total) by mouth 2 (two) times daily before a meal.   propranolol 10 MG tablet Commonly known as: INDERAL Take 10 mg by mouth daily.   sertraline 100 MG tablet Commonly known as: ZOLOFT Take 100 mg by mouth daily.   tadalafil 5 MG tablet Commonly known as: CIALIS Take 5 mg by mouth daily as needed for erectile dysfunction.   tamsulosin 0.4 MG Caps capsule Commonly known as: FLOMAX TAKE ONE CAPSULE BY MOUTH DAILY FOR PROSTATE   triamcinolone cream 0.1 % Commonly known as: KENALOG Apply topically.   Vitamin D3 1.25 MG (50000 UT) Tabs Take by mouth.        Signature:  Coralyn Helling, MD [facility] Pulmonary/Critical Care Pager - [phone] 10/15/2022, 8:57 AM

## 2022-10-17 ENCOUNTER — Ambulatory Visit (INDEPENDENT_AMBULATORY_CARE_PROVIDER_SITE_OTHER): Payer: No Typology Code available for payment source

## 2022-10-17 DIAGNOSIS — J309 Allergic rhinitis, unspecified: Secondary | ICD-10-CM

## 2022-10-24 ENCOUNTER — Ambulatory Visit (INDEPENDENT_AMBULATORY_CARE_PROVIDER_SITE_OTHER): Payer: No Typology Code available for payment source

## 2022-10-24 DIAGNOSIS — J309 Allergic rhinitis, unspecified: Secondary | ICD-10-CM | POA: Diagnosis not present

## 2022-10-26 ENCOUNTER — Ambulatory Visit (INDEPENDENT_AMBULATORY_CARE_PROVIDER_SITE_OTHER): Payer: No Typology Code available for payment source

## 2022-10-26 DIAGNOSIS — J309 Allergic rhinitis, unspecified: Secondary | ICD-10-CM | POA: Diagnosis not present

## 2022-10-31 ENCOUNTER — Ambulatory Visit (INDEPENDENT_AMBULATORY_CARE_PROVIDER_SITE_OTHER): Payer: No Typology Code available for payment source

## 2022-10-31 DIAGNOSIS — J309 Allergic rhinitis, unspecified: Secondary | ICD-10-CM | POA: Diagnosis not present

## 2022-11-08 ENCOUNTER — Telehealth: Payer: Self-pay | Admitting: Allergy & Immunology

## 2022-11-08 ENCOUNTER — Encounter: Payer: Self-pay | Admitting: Internal Medicine

## 2022-11-08 NOTE — Telephone Encounter (Signed)
Patients current authorization ([id number]) is set to expire 12-06-2022.   Faxed renewal of services request to [facility], [phone].   Called and left detailed message for patient to reach out to [place] as well and advise him of the renewal of services.

## 2022-11-23 ENCOUNTER — Ambulatory Visit (INDEPENDENT_AMBULATORY_CARE_PROVIDER_SITE_OTHER): Payer: No Typology Code available for payment source

## 2022-11-23 DIAGNOSIS — J309 Allergic rhinitis, unspecified: Secondary | ICD-10-CM | POA: Diagnosis not present

## 2022-11-28 ENCOUNTER — Ambulatory Visit (INDEPENDENT_AMBULATORY_CARE_PROVIDER_SITE_OTHER): Payer: No Typology Code available for payment source

## 2022-11-28 DIAGNOSIS — J309 Allergic rhinitis, unspecified: Secondary | ICD-10-CM | POA: Diagnosis not present

## 2022-11-30 ENCOUNTER — Ambulatory Visit (INDEPENDENT_AMBULATORY_CARE_PROVIDER_SITE_OTHER): Payer: No Typology Code available for payment source

## 2022-11-30 DIAGNOSIS — J309 Allergic rhinitis, unspecified: Secondary | ICD-10-CM | POA: Diagnosis not present

## 2022-12-07 ENCOUNTER — Ambulatory Visit (INDEPENDENT_AMBULATORY_CARE_PROVIDER_SITE_OTHER): Payer: Medicare PPO

## 2022-12-07 DIAGNOSIS — J309 Allergic rhinitis, unspecified: Secondary | ICD-10-CM

## 2022-12-10 ENCOUNTER — Telehealth: Payer: Self-pay

## 2022-12-10 NOTE — Telephone Encounter (Signed)
Patients allergy vials expired 12/12/2022, he last received Blue 0.35 ml on 12/07/2022. Left a message for patient to call the office, we will need verbal consent to order a full vial set.

## 2022-12-10 NOTE — Telephone Encounter (Signed)
Patient called back and gave verbal consent to order a full vial set. I have reached out to [place] regarding his [place] authorization before we re-order.

## 2022-12-14 NOTE — Telephone Encounter (Signed)
Albin Felling and update on patients VA authorization?

## 2022-12-18 NOTE — Telephone Encounter (Signed)
Reached out to [facility] to check on status of authorization request. Was told patient switched PCPs and now attends the [facility]. Nurse let me know she would reach out to patient and let him know he needs to ask PCP at [facility] to start new authorization.   Received note from [place],   "[facility] [place] [facility] is under [facility], please fax over request for RFS to Attn: Dr. Dennard Schaumann fax: [phone]  I reached out to Vet unable to reach to inform him his VA PCP would have to reauthorize the rfs, left him a message."   Faxed over original renewal of services form to [phone]. Also reached out to patient & left message for him to call back to discuss. Patient is unable to come in for injections & unable to order vials without authorization  (UNLESS he chooses to file a [employer].).   I also reach out to [facility] (as [facility] falls under the [facility] community care) via email   Received email back:   "Hi,  This veteran has not a [place] for [employer] [facility]  The [facility] will need to discuss this with his VA PCP so an order can be placed.  Thank you  Azalee Course Advanced Medical Support Assistant, Care in the [facility] Phone: [phone] [phone] Cell: [phone] [email or web address]"   Patient needs to reach out to PCP at [facility].

## 2023-01-03 NOTE — Telephone Encounter (Signed)
Alexander Gutierrez is there any updates on this?

## 2023-01-11 NOTE — Telephone Encounter (Signed)
Patient called and I updated him on [place] message. He is going to reach out to his VA PCP & give [place] an update once he hears something back.

## 2023-01-28 NOTE — Telephone Encounter (Signed)
[place], [phone] ext [phone] to check to see if patient had an authorization started.   Spoke to [employer], patient does not have an authorization started for our practice. Joy transferred me to patient's PCP at the [place].   Spoke to a nurse to check to see if patient has spoken to his PCP to get an authorization with our practice as he was getting injections. Nurse states she will have to look into this. Nurse took down my name and the [place] phone number & stated she will give me a call back.

## 2023-01-29 NOTE — Telephone Encounter (Signed)
Received call from [place] with Dr. Rondell Reams office at the [facility]. Jenna did not see a new consult for allergies with patient's PCP.   Alexander Gutierrez patient has a follow up with our office on 02-13-2023 with Dr. Dellis Anes & we are needing new authorization. Eileen Stanford states she will call patient to see if he wants to continue to be seen at our practice. If patient confirms, Eileen Stanford will send consult request to patient's provider.

## 2023-02-13 ENCOUNTER — Encounter: Payer: Self-pay | Admitting: Allergy & Immunology

## 2023-02-13 ENCOUNTER — Ambulatory Visit (INDEPENDENT_AMBULATORY_CARE_PROVIDER_SITE_OTHER): Payer: No Typology Code available for payment source | Admitting: Allergy & Immunology

## 2023-02-13 ENCOUNTER — Other Ambulatory Visit: Payer: Self-pay

## 2023-02-13 VITALS — BP 134/80 | HR 97 | Temp 97.2°F | Resp 16 | Ht 70.0 in | Wt 302.6 lb

## 2023-02-13 DIAGNOSIS — J302 Other seasonal allergic rhinitis: Secondary | ICD-10-CM

## 2023-02-13 DIAGNOSIS — J3089 Other allergic rhinitis: Secondary | ICD-10-CM | POA: Diagnosis not present

## 2023-02-13 DIAGNOSIS — R0602 Shortness of breath: Secondary | ICD-10-CM

## 2023-02-13 DIAGNOSIS — L299 Pruritus, unspecified: Secondary | ICD-10-CM | POA: Diagnosis not present

## 2023-02-13 DIAGNOSIS — J3081 Allergic rhinitis due to animal (cat) (dog) hair and dander: Secondary | ICD-10-CM | POA: Diagnosis not present

## 2023-02-13 MED ORDER — AZELASTINE HCL 0.1 % NA SOLN: 2.0000 | Freq: Two times a day (BID) | NASAL | 5 refills | Status: AC

## 2023-02-13 MED ORDER — FLUTICASONE PROPIONATE 50 MCG/ACT NA SUSP: NASAL | 5 refills | Status: DC

## 2023-02-13 NOTE — Patient Instructions (Addendum)
1. SOB (shortness of breath) - with normal chest X-ray  - Lung testing looks good today.  - Continue with Pulmonology at the [place].   2. Seasonal and perennial allergic rhinitis - with itching - Previous testing showed: grasses, ragweed, weeds, indoor molds, outdoor molds, dust mites, and cat. - Continue with: Astelin (azelastine) 2 sprays per nostril 1-2 times daily as needed.  - Consider nasal saline rinses 1-2 times daily to remove allergens from the nasal cavities as well as help with mucous clearance (this is especially helpful to do before the nasal sprays are given) - We have to remix your vials, so make an appointment to start in 2-3 weeks again.   3. Pruritis (itching) - This was improved with the allergy shots.   4. Return in about 6 months (around 08/16/2023).    Please inform us of any Emergency Department visits, hospitalizations, or changes in symptoms. Call us before going to the ED for breathing or allergy symptoms since we might be able to fit you in for a sick visit. Feel free to contact us anytime with any questions, problems, or concerns.  It was a pleasure to see you again today!  Websites that have reliable patient information: 1. American Academy of Asthma, Allergy, and Immunology: www.aaaai.org 2. Food Allergy Research and Education (FARE): foodallergy.org 3. Mothers of Asthmatics: http://www.asthmacommunitynetwork.org 4. American College of Allergy, Asthma, and Immunology: www.acaai.org   COVID-19 Vaccine Information can be found at: [email or web address].[email or web address] For questions related to vaccine distribution or appointments, please email [email or web address] or call [phone].   We realize that you might be concerned about having an allergic reaction to the COVID19 vaccines. To help with that concern, WE ARE OFFERING THE COVID19 VACCINES IN OUR OFFICE! Ask the front desk for dates!     "Like" [place] on Facebook and  Instagram for our latest updates!      A healthy democracy works best when [employer] participate! Make sure you are registered to vote! If you have moved or changed any of your contact information, you will need to get this updated before voting!  In some cases, you MAY be able to register to vote online: [email or web address]

## 2023-02-13 NOTE — Progress Notes (Addendum)
FOLLOW UP  Date of Service/Encounter:  02/13/23   Assessment:   SOB (shortness of breath) - spiro looked good today   OSA - on CPAP (followed by Dr. Craige Cotta)   Seasonal and perennial allergic rhinitis (grasses, ragweed, weeds, indoor molds, outdoor molds, dust mites, and cat) - doing better on allergen immunotherapy, but not up to maintenance   Chronic back pain   PTSD  Plan/Recommendations:   1. SOB (shortness of breath) - with normal chest X-ray  - Lung testing looks good today.  - Continue with Pulmonology at the [facility].  - Continue with Lung Clear as you are doing.   2. Seasonal and perennial allergic rhinitis - with itching - Previous testing showed: grasses, ragweed, weeds, indoor molds, outdoor molds, dust mites, and cat. - Continue with: Astelin (azelastine) 2 sprays per nostril 1-2 times daily as needed.  - Consider nasal saline rinses 1-2 times daily to remove allergens from the nasal cavities as well as help with mucous clearance (this is especially helpful to do before the nasal sprays are given) - We have to remix your vials, so make an appointment to start in 2-3 weeks again.   3. Pruritis (itching) - This was improved with the allergy shots.   4. Return in about 6 months (around 08/16/2023).    Subjective:   Alexander Gutierrez is a 62 y.o. male presenting today for follow up of  Chief Complaint  Patient presents with   Other    Sinus Congestion on the left side and neck pain not sure if it is connected. 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  Alexander Gutierrez is a 62 y.o. male presenting for a follow up visit. He was last seen in February 2024. At that time, he was doing well with the Lung Clear as needed. This is an OTC homeopathic medication. For his rhinitis, we continued with the use of Flonase and allergy shots. For his itching, he was doing better on the allergy shots.   Since the last visit, he has done well.  However, the [place] let his authorization lapsed, though he has not had any allergy shots since June 2024.  Allergic Rhinitis Symptom History: [facility] took a while to renew [facility].  He has not had injections since June.  He remains on the Astelin, which she feels does better than the Flonase.  he is continuing to have some headaches, but he has a history ofof stem cell injection of his neck  neck injuries.  He is actually going to have to help with the healing process.  This is scheduled from September 4.  You might have to put in a nerve stimulator if this is not successful.  Alexander Gutierrez was on allergen immunotherapy.  His last injection was in June 2024.  He received two injections. Immunotherapy script #1 contains molds and dust mites.  He received 0.65mL of the BLUE vial (1/100,000). Immunotherapy script #2 contains  ragweed, weeds, grasses, and cat. He received 0.52mL of the BLUE vial (1/100,000). He started shots July of 2023 and not yet reached maintenance.  Otherwise, there have been no changes to his past medical history, surgical history, family history, or social history.    Review of systems otherwise negative other than that mentioned in the HPI.    Objective:   Blood pressure 134/80, pulse 97, temperature (!) 97.2 F (36.2 C), resp. rate 16, height 5\' 10"  (1.778 m), weight (!) 302 lb 9.6 oz (137.3 kg), SpO2 95%. Body mass  index is 43.42 kg/m.    Physical Exam Vitals reviewed.  Constitutional:      Appearance: He is well-developed.     Comments: Joking.   HENT:     Head: Normocephalic and atraumatic.     Right Ear: Tympanic membrane, ear canal and external ear normal. No drainage, swelling or tenderness. Tympanic membrane is not injected, scarred, erythematous, retracted or bulging.     Left Ear: Tympanic membrane, ear canal and external ear normal. No drainage, swelling or tenderness. Tympanic membrane is not injected, scarred, erythematous, retracted or bulging.     Nose: No nasal deformity, septal deviation, mucosal edema or rhinorrhea.     Right Turbinates: Enlarged, swollen and pale.     Left Turbinates: Enlarged, swollen and pale.     Right Sinus: No maxillary sinus tenderness or frontal sinus tenderness.     Left Sinus: No maxillary sinus tenderness or frontal sinus tenderness.     Comments: No nasal polyps noted.     Mouth/Throat:     Mouth: Mucous membranes are not pale and not dry.     Pharynx: Uvula midline.  Eyes:     General:        Right eye: No discharge.        Left eye: No discharge.     Conjunctiva/sclera: Conjunctivae normal.     Right eye: Right conjunctiva is not injected. No chemosis.    Left eye: Left conjunctiva is not injected. No chemosis.    Pupils: Pupils are equal, round, and reactive to light.  Cardiovascular:     Rate and Rhythm: Normal rate and regular rhythm.     Heart sounds: Normal heart sounds.  Pulmonary:     Effort: Pulmonary effort is normal. No tachypnea, accessory muscle usage or respiratory distress.     Breath sounds: Normal breath sounds. No wheezing, rhonchi or rales.  Chest:     Chest wall: No tenderness.  Abdominal:     Tenderness: There is no abdominal tenderness. There is no guarding or rebound.  Lymphadenopathy:     Head:     Right side of head: No submandibular, tonsillar or occipital adenopathy.     Left side of head: No submandibular,  tonsillar or occipital adenopathy.     Cervical: No cervical adenopathy.  Skin:    Coloration: Skin is not pale.     Findings: No abrasion, erythema, petechiae or rash. Rash is not papular, urticarial or vesicular.  Neurological:     Mental Status: He is alert.  Psychiatric:        Behavior: Behavior is cooperative.      Diagnostic studies:    Spirometry: results normal (FEV1: 2.70/77%, FVC: 3.21/70%, FEV1/FVC: 84%).    Spirometry consistent with normal pattern.    Allergy Studies: none      Alexander Bonds, MD  [facility]

## 2023-02-13 NOTE — Telephone Encounter (Signed)
Received new authorization, [id number]  with preliminary dates of 02-01-2023 to 02-01-2024  Authorization has been indexed into media, AMB Referral - [facility] AUTH. PRELIMINARY EXP 02-01-24

## 2023-02-13 NOTE — Progress Notes (Signed)
VIALS EXP 02-13-24

## 2023-02-13 NOTE — Addendum Note (Signed)
Addended by: Alfonse Spruce on: 02/13/2023 09:41 AM   Modules accepted: Orders

## 2023-02-14 DIAGNOSIS — J3089 Other allergic rhinitis: Secondary | ICD-10-CM

## 2023-03-06 ENCOUNTER — Ambulatory Visit (INDEPENDENT_AMBULATORY_CARE_PROVIDER_SITE_OTHER): Payer: No Typology Code available for payment source

## 2023-03-06 DIAGNOSIS — J309 Allergic rhinitis, unspecified: Secondary | ICD-10-CM | POA: Diagnosis not present

## 2023-03-06 NOTE — Progress Notes (Signed)
Immunotherapy   Patient Details  Name: Alexander Gutierrez MRN: [id number] Date of Birth: 11/26/1960  03/06/2023  Alexander Gutierrez started injections for  grasses, weeds, ragweed's, cats, molds, and dust mites for the second time.  Following schedule: A  Frequency:2 times per week Epi-Pen:Epi-Pen Available  Consent signed previously and patient instructions given.   Ralene Muskrat 03/06/2023, 9:24 AM

## 2023-03-13 ENCOUNTER — Ambulatory Visit (INDEPENDENT_AMBULATORY_CARE_PROVIDER_SITE_OTHER): Payer: No Typology Code available for payment source

## 2023-03-13 DIAGNOSIS — J309 Allergic rhinitis, unspecified: Secondary | ICD-10-CM | POA: Diagnosis not present

## 2023-03-15 ENCOUNTER — Emergency Department (HOSPITAL_COMMUNITY)
Admission: EM | Admit: 2023-03-15 | Discharge: 2023-03-16 | Disposition: A | Discharge status: Home / Self Care | Payer: No Typology Code available for payment source | Attending: Emergency Medicine | Admitting: Emergency Medicine

## 2023-03-15 ENCOUNTER — Encounter (HOSPITAL_COMMUNITY): Payer: Self-pay

## 2023-03-15 ENCOUNTER — Other Ambulatory Visit: Payer: Self-pay

## 2023-03-15 DIAGNOSIS — Z20822 Contact with and (suspected) exposure to covid-19: Secondary | ICD-10-CM | POA: Insufficient documentation

## 2023-03-15 DIAGNOSIS — B349 Viral infection, unspecified: Secondary | ICD-10-CM | POA: Insufficient documentation

## 2023-03-15 DIAGNOSIS — M791 Myalgia, unspecified site: Secondary | ICD-10-CM | POA: Diagnosis present

## 2023-03-15 LAB — CBC
HCT: 40.4 % (ref 39.0–52.0)
Hemoglobin: 14.2 g/dL (ref 13.0–17.0)
MCH: 30.8 pg (ref 26.0–34.0)
MCHC: 35.1 g/dL (ref 30.0–36.0)
MCV: 87.6 fL (ref 80.0–100.0)
Platelets: 204 10*3/uL (ref 150–400)
RBC: 4.61 MIL/uL (ref 4.22–5.81)
RDW: 12.9 % (ref 11.5–15.5)
WBC: 6 10*3/uL (ref 4.0–10.5)
nRBC: 0 % (ref 0.0–0.2)

## 2023-03-15 LAB — COMPREHENSIVE METABOLIC PANEL
ALT: 37 U/L (ref 0–44)
AST: 28 U/L (ref 15–41)
Albumin: 4.1 g/dL (ref 3.5–5.0)
Alkaline Phosphatase: 56 U/L (ref 38–126)
Anion gap: 10 (ref 5–15)
BUN: 18 mg/dL (ref 8–23)
CO2: 23 mmol/L (ref 22–32)
Calcium: 8.5 mg/dL — ABNORMAL LOW (ref 8.9–10.3)
Chloride: 100 mmol/L (ref 98–111)
Creatinine, Ser: 1.14 mg/dL (ref 0.61–1.24)
GFR, Estimated: >60 mL/min (ref >60)
Glucose, Bld: 149 mg/dL — ABNORMAL HIGH (ref 70–99)
Potassium: 3.8 mmol/L (ref 3.5–5.1)
Sodium: 133 mmol/L — ABNORMAL LOW (ref 135–145)
Total Bilirubin: 0.7 mg/dL (ref 0.3–1.2)
Total Protein: 6.7 g/dL (ref 6.5–8.1)

## 2023-03-15 LAB — LIPASE, BLOOD: Lipase: 22 U/L (ref 11–51)

## 2023-03-15 LAB — GROUP A STREP BY PCR: Group A Strep by PCR: NOT DETECTED

## 2023-03-15 LAB — SARS CORONAVIRUS 2 BY RT PCR: SARS Coronavirus 2 by RT PCR: NEGATIVE

## 2023-03-15 NOTE — ED Triage Notes (Signed)
Pt presents to ED with c/o upper abd pain, pain, nausea/vomiting, sweating, achy joints, sore throat, pt reports feeling worse throughout day, pt daughter is here in ED also and has sore throat with swollen lymph nodes.

## 2023-03-16 LAB — MONONUCLEOSIS SCREEN: Mono Screen: NEGATIVE

## 2023-03-16 MED ORDER — ONDANSETRON HCL 4 MG/2ML IJ SOLN
4.0000 mg | Freq: Once | INTRAMUSCULAR | Status: AC
  Administered 2023-03-16: 4 mg via INTRAVENOUS
  Filled 2023-03-16: qty 2

## 2023-03-16 MED ORDER — ONDANSETRON 8 MG PO TBDP: ORAL_TABLET | ORAL | 0 refills | Status: DC

## 2023-03-16 MED ORDER — KETOROLAC TROMETHAMINE 30 MG/ML IJ SOLN
30.0000 mg | Freq: Once | INTRAMUSCULAR | Status: AC
  Administered 2023-03-16: 30 mg via INTRAVENOUS
  Filled 2023-03-16: qty 1

## 2023-03-16 MED ORDER — SODIUM CHLORIDE 0.9 % IV BOLUS
1000.0000 mL | Freq: Once | INTRAVENOUS | Status: AC
  Administered 2023-03-16: 1000 mL via INTRAVENOUS

## 2023-03-16 NOTE — ED Notes (Signed)
Pt reports daughter's results just came back positive for mono- Dr Judd Lien made aware.

## 2023-03-16 NOTE — ED Provider Notes (Signed)
[place] EMERGENCY DEPARTMENT AT [facility] Provider Note   CSN: [id number] Arrival date & time: 03/15/23  2243     History  Chief Complaint  Patient presents with   Chest Pain    Alexander Gutierrez is a 62 y.o. male.  Patient is a 62 year old male with past medical history of hyperlipidemia.  Patient presenting today for evaluation of bodyaches, nausea, chills, and feeling generally unwell.  Symptoms began suddenly this morning.  He denies fevers.  No cough.  Patient's daughter was diagnosed this evening with mono, but patient denies sore throat.  No alleviating factors.  The history is provided by the patient.       Home Medications Prior to Admission medications   Medication Sig Start Date End Date Taking? Authorizing Provider  albuterol (PROVENTIL) (2.5 MG/3ML) 0.083% nebulizer solution Take 2.5 mg by nebulization every 6 (six) hours as needed for wheezing or shortness of breath.    [provider]  allopurinol (ZYLOPRIM) 100 MG tablet Take 100 mg by mouth daily.    [provider]  atorvastatin (LIPITOR) 20 MG tablet Take 20 mg by mouth daily.    [provider]  azelastine (ASTELIN) 0.1 % nasal spray Place 2 sprays into both nostrils 2 (two) times daily. 02/13/23 05/14/23  Alfonse Spruce, MD  cetirizine (ZYRTEC) 10 MG tablet Take 1 tablet (10 mg total) by mouth 2 (two) times daily. 09/25/22   Alfonse Spruce, MD  Cholecalciferol (VITAMIN D3) 1.25 MG (50000 UT) TABS Take by mouth.    [provider]  clotrimazole (LOTRIMIN) 1 % cream APPLY SMALL AMOUNT TO AFFECTED AREA DAILY FOR FEET 08/14/22   [provider]  cyanocobalamin (VITAMIN B12) 500 MCG tablet TAKE TWO TABLETS BY MOUTH DAILY FOR B12 SUPPLEMENT 05/20/22   [provider]  EPINEPHrine 0.3 mg/0.3 mL IJ SOAJ injection Inject 0.3 mg into the muscle as needed for anaphylaxis. 12/06/21   Alfonse Spruce, MD  fluticasone [facility]) 50 MCG/ACT nasal  spray Place 1-2 sprays in each nostril once a day as needed for stuffy nose. In the right nostril, point the applicator out toward the right ear. In the left nostril, point the applicator out toward the left ear 02/13/23   Alfonse Spruce, MD  furosemide (LASIX) 20 MG tablet Take 20 mg by mouth 2 (two) times daily. 02/28/21   [provider]  ibuprofen (ADVIL) 600 MG tablet Take 600 mg by mouth every 4 (four) hours as needed for moderate pain. Given for after dental procedure 02/28/22    [provider]  pantoprazole (PROTONIX) 40 MG tablet Take 1 tablet (40 mg total) by mouth 2 (two) times daily before a meal. 03/05/22 03/05/23  Carver, Hennie Duos, DO  propranolol (INDERAL) 10 MG tablet Take 10 mg by mouth daily.    [provider]  sertraline (ZOLOFT) 100 MG tablet Take 100 mg by mouth daily.    [provider]  tadalafil (CIALIS) 5 MG tablet Take 5 mg by mouth daily as needed for erectile dysfunction.    [provider]  tamsulosin (FLOMAX) 0.4 MG CAPS capsule TAKE ONE CAPSULE BY MOUTH DAILY FOR PROSTATE 05/20/22   [provider]  triamcinolone cream (KENALOG) 0.1 % Apply topically. 12/06/21   [provider]      Allergies    Patient has no known allergies.    Review of Systems   Review of Systems  All other systems reviewed and are negative.  Physical Exam Updated Vital Signs BP 110/62   Pulse 81   Temp 98.9 F (37.2 C) (Oral)   Resp 19   SpO2 93%  Physical Exam Vitals and nursing note reviewed.  Constitutional:      General: He is not in acute distress.    Appearance: He is well-developed. He is not diaphoretic.  HENT:     Head: Normocephalic and atraumatic.  Cardiovascular:     Rate and Rhythm: Normal rate and regular rhythm.     Heart sounds: No murmur heard.    No friction rub.  Pulmonary:     Effort: Pulmonary effort is normal. No respiratory distress.     Breath sounds: Normal breath sounds. No wheezing  or rales.  Abdominal:     General: Bowel sounds are normal. There is no distension.     Palpations: Abdomen is soft.     Tenderness: There is no abdominal tenderness.  Musculoskeletal:        General: Normal range of motion.     Cervical back: Normal range of motion and neck supple.  Skin:    General: Skin is warm and dry.  Neurological:     Mental Status: He is alert and oriented to person, place, and time.     Coordination: Coordination normal.     ED Results / Procedures / Treatments   Labs (all labs ordered are listed, but only abnormal results are displayed) Labs Reviewed  COMPREHENSIVE METABOLIC PANEL - Abnormal; Notable for the following components:      Result Value   Sodium 133 (*)    Glucose, Bld 149 (*)    Calcium 8.5 (*)    All other components within normal limits  SARS CORONAVIRUS 2 BY RT PCR  GROUP A STREP BY PCR  LIPASE, BLOOD  CBC  MONONUCLEOSIS SCREEN  URINALYSIS, ROUTINE W REFLEX MICROSCOPIC    EKG   ED ECG REPORT   Date: 03/16/2023  Rate: 93  Rhythm: normal sinus rhythm  QRS Axis: normal  Intervals: normal  ST/T Wave abnormalities: normal  Conduction Disutrbances:none  Narrative Interpretation:   Old EKG Reviewed: none available  I have personally reviewed the EKG tracing and agree with the computerized printout as noted.   Radiology No results found.  Procedures Procedures    Medications Ordered in ED Medications  sodium chloride 0.9 % bolus 1,000 mL (has no administration in time range)  ondansetron (ZOFRAN) injection 4 mg (has no administration in time range)  ketorolac (TORADOL) 30 MG/ML injection 30 mg (has no administration in time range)    ED Course/ Medical Decision Making/ A&P  Patient is a 62 year old male presenting with complaints of chest pain, body aches, chills, nausea since early this morning.  Patient arrives here afebrile with stable vital signs.  Physical examination nonfocal.  Workup initiated including  CBC, CMP, and lipase, all of which are unremarkable.  Monoscreen is negative.  COVID test negative.  Strep swab negative.  Patient has been hydrated with normal saline and given medicine for pain and nausea and seems to be feeling better.  Patient to be discharged with nausea medication, plenty of fluids, rest, and follow-up as needed.  I suspect his symptoms are viral in nature.  Final Clinical Impression(s) / ED Diagnoses Final diagnoses:  None    Rx / DC Orders ED Discharge Orders     None         Geoffery Lyons, MD 03/16/23 [phone]

## 2023-03-16 NOTE — Discharge Instructions (Signed)
Drink plenty of fluids and get plenty of rest.  Take ibuprofen 600 mg every 6 hours as needed for pain.  Begin taking Zofran as prescribed as needed for nausea.  Follow-up with primary doctor if not improving in the next few days.

## 2023-04-03 ENCOUNTER — Ambulatory Visit (INDEPENDENT_AMBULATORY_CARE_PROVIDER_SITE_OTHER): Payer: No Typology Code available for payment source

## 2023-04-03 DIAGNOSIS — J309 Allergic rhinitis, unspecified: Secondary | ICD-10-CM | POA: Diagnosis not present

## 2023-04-17 ENCOUNTER — Ambulatory Visit (INDEPENDENT_AMBULATORY_CARE_PROVIDER_SITE_OTHER): Payer: No Typology Code available for payment source

## 2023-04-17 DIAGNOSIS — J309 Allergic rhinitis, unspecified: Secondary | ICD-10-CM | POA: Diagnosis not present

## 2023-04-17 MED ORDER — EPINEPHRINE 0.3 MG/0.3ML IJ SOAJ: 0.3000 mg | INTRAMUSCULAR | 1 refills | Status: DC | PRN

## 2023-04-24 ENCOUNTER — Ambulatory Visit (INDEPENDENT_AMBULATORY_CARE_PROVIDER_SITE_OTHER): Payer: No Typology Code available for payment source

## 2023-04-24 DIAGNOSIS — J309 Allergic rhinitis, unspecified: Secondary | ICD-10-CM | POA: Diagnosis not present

## 2023-04-24 MED ORDER — EPINEPHRINE 0.3 MG/0.3ML IJ SOAJ: 0.3000 mg | INTRAMUSCULAR | 1 refills | Status: AC | PRN

## 2023-05-01 ENCOUNTER — Ambulatory Visit (INDEPENDENT_AMBULATORY_CARE_PROVIDER_SITE_OTHER): Payer: No Typology Code available for payment source

## 2023-05-01 DIAGNOSIS — J309 Allergic rhinitis, unspecified: Secondary | ICD-10-CM | POA: Diagnosis not present

## 2023-05-03 ENCOUNTER — Ambulatory Visit (INDEPENDENT_AMBULATORY_CARE_PROVIDER_SITE_OTHER): Payer: No Typology Code available for payment source

## 2023-05-03 DIAGNOSIS — J309 Allergic rhinitis, unspecified: Secondary | ICD-10-CM

## 2023-05-08 ENCOUNTER — Ambulatory Visit (INDEPENDENT_AMBULATORY_CARE_PROVIDER_SITE_OTHER): Payer: No Typology Code available for payment source

## 2023-05-08 DIAGNOSIS — J309 Allergic rhinitis, unspecified: Secondary | ICD-10-CM

## 2023-05-10 ENCOUNTER — Ambulatory Visit (INDEPENDENT_AMBULATORY_CARE_PROVIDER_SITE_OTHER): Payer: No Typology Code available for payment source

## 2023-05-10 DIAGNOSIS — J309 Allergic rhinitis, unspecified: Secondary | ICD-10-CM | POA: Diagnosis not present

## 2023-05-13 ENCOUNTER — Ambulatory Visit (INDEPENDENT_AMBULATORY_CARE_PROVIDER_SITE_OTHER): Payer: No Typology Code available for payment source

## 2023-05-13 DIAGNOSIS — J309 Allergic rhinitis, unspecified: Secondary | ICD-10-CM | POA: Diagnosis not present

## 2023-05-15 ENCOUNTER — Ambulatory Visit (INDEPENDENT_AMBULATORY_CARE_PROVIDER_SITE_OTHER): Payer: No Typology Code available for payment source | Admitting: *Deleted

## 2023-05-15 DIAGNOSIS — J309 Allergic rhinitis, unspecified: Secondary | ICD-10-CM

## 2023-05-22 ENCOUNTER — Ambulatory Visit (INDEPENDENT_AMBULATORY_CARE_PROVIDER_SITE_OTHER): Payer: No Typology Code available for payment source

## 2023-05-22 DIAGNOSIS — J309 Allergic rhinitis, unspecified: Secondary | ICD-10-CM

## 2023-05-29 ENCOUNTER — Ambulatory Visit (INDEPENDENT_AMBULATORY_CARE_PROVIDER_SITE_OTHER): Payer: No Typology Code available for payment source

## 2023-05-29 DIAGNOSIS — J309 Allergic rhinitis, unspecified: Secondary | ICD-10-CM | POA: Diagnosis not present

## 2023-06-05 ENCOUNTER — Ambulatory Visit (INDEPENDENT_AMBULATORY_CARE_PROVIDER_SITE_OTHER): Payer: No Typology Code available for payment source

## 2023-06-05 DIAGNOSIS — J309 Allergic rhinitis, unspecified: Secondary | ICD-10-CM | POA: Diagnosis not present

## 2023-06-07 ENCOUNTER — Ambulatory Visit (INDEPENDENT_AMBULATORY_CARE_PROVIDER_SITE_OTHER): Payer: No Typology Code available for payment source

## 2023-06-07 DIAGNOSIS — J309 Allergic rhinitis, unspecified: Secondary | ICD-10-CM | POA: Diagnosis not present

## 2023-06-10 ENCOUNTER — Ambulatory Visit (INDEPENDENT_AMBULATORY_CARE_PROVIDER_SITE_OTHER): Payer: No Typology Code available for payment source

## 2023-06-10 DIAGNOSIS — J309 Allergic rhinitis, unspecified: Secondary | ICD-10-CM | POA: Diagnosis not present

## 2023-06-17 ENCOUNTER — Ambulatory Visit (INDEPENDENT_AMBULATORY_CARE_PROVIDER_SITE_OTHER): Payer: No Typology Code available for payment source

## 2023-06-17 DIAGNOSIS — J309 Allergic rhinitis, unspecified: Secondary | ICD-10-CM

## 2023-06-21 ENCOUNTER — Ambulatory Visit (INDEPENDENT_AMBULATORY_CARE_PROVIDER_SITE_OTHER): Payer: No Typology Code available for payment source

## 2023-06-21 DIAGNOSIS — J309 Allergic rhinitis, unspecified: Secondary | ICD-10-CM | POA: Diagnosis not present

## 2023-06-26 ENCOUNTER — Ambulatory Visit (INDEPENDENT_AMBULATORY_CARE_PROVIDER_SITE_OTHER): Payer: No Typology Code available for payment source

## 2023-06-26 DIAGNOSIS — J309 Allergic rhinitis, unspecified: Secondary | ICD-10-CM | POA: Diagnosis not present

## 2023-07-03 ENCOUNTER — Ambulatory Visit (INDEPENDENT_AMBULATORY_CARE_PROVIDER_SITE_OTHER): Payer: No Typology Code available for payment source

## 2023-07-03 DIAGNOSIS — J309 Allergic rhinitis, unspecified: Secondary | ICD-10-CM | POA: Diagnosis not present

## 2023-07-10 ENCOUNTER — Ambulatory Visit (INDEPENDENT_AMBULATORY_CARE_PROVIDER_SITE_OTHER): Payer: No Typology Code available for payment source

## 2023-07-10 DIAGNOSIS — J309 Allergic rhinitis, unspecified: Secondary | ICD-10-CM | POA: Diagnosis not present

## 2023-07-19 ENCOUNTER — Ambulatory Visit (INDEPENDENT_AMBULATORY_CARE_PROVIDER_SITE_OTHER): Payer: No Typology Code available for payment source

## 2023-07-19 DIAGNOSIS — J309 Allergic rhinitis, unspecified: Secondary | ICD-10-CM

## 2023-07-22 ENCOUNTER — Ambulatory Visit: Payer: Medicare PPO | Admitting: Urology

## 2023-07-24 ENCOUNTER — Ambulatory Visit (INDEPENDENT_AMBULATORY_CARE_PROVIDER_SITE_OTHER): Payer: No Typology Code available for payment source

## 2023-07-24 DIAGNOSIS — J309 Allergic rhinitis, unspecified: Secondary | ICD-10-CM

## 2023-07-26 ENCOUNTER — Ambulatory Visit (INDEPENDENT_AMBULATORY_CARE_PROVIDER_SITE_OTHER): Payer: No Typology Code available for payment source

## 2023-07-26 DIAGNOSIS — J309 Allergic rhinitis, unspecified: Secondary | ICD-10-CM

## 2023-07-31 ENCOUNTER — Ambulatory Visit (INDEPENDENT_AMBULATORY_CARE_PROVIDER_SITE_OTHER): Payer: No Typology Code available for payment source

## 2023-07-31 DIAGNOSIS — J309 Allergic rhinitis, unspecified: Secondary | ICD-10-CM | POA: Diagnosis not present

## 2023-08-09 ENCOUNTER — Ambulatory Visit (INDEPENDENT_AMBULATORY_CARE_PROVIDER_SITE_OTHER): Payer: No Typology Code available for payment source

## 2023-08-09 DIAGNOSIS — J309 Allergic rhinitis, unspecified: Secondary | ICD-10-CM

## 2023-08-12 ENCOUNTER — Ambulatory Visit (INDEPENDENT_AMBULATORY_CARE_PROVIDER_SITE_OTHER): Payer: No Typology Code available for payment source

## 2023-08-12 DIAGNOSIS — J309 Allergic rhinitis, unspecified: Secondary | ICD-10-CM | POA: Diagnosis not present

## 2023-08-14 ENCOUNTER — Ambulatory Visit (INDEPENDENT_AMBULATORY_CARE_PROVIDER_SITE_OTHER): Payer: No Typology Code available for payment source

## 2023-08-14 DIAGNOSIS — J309 Allergic rhinitis, unspecified: Secondary | ICD-10-CM

## 2023-08-21 ENCOUNTER — Ambulatory Visit: Payer: No Typology Code available for payment source | Admitting: Allergy & Immunology

## 2023-08-23 ENCOUNTER — Ambulatory Visit (INDEPENDENT_AMBULATORY_CARE_PROVIDER_SITE_OTHER): Payer: Self-pay

## 2023-08-23 DIAGNOSIS — J309 Allergic rhinitis, unspecified: Secondary | ICD-10-CM | POA: Diagnosis not present

## 2023-08-28 ENCOUNTER — Ambulatory Visit (INDEPENDENT_AMBULATORY_CARE_PROVIDER_SITE_OTHER)

## 2023-08-28 DIAGNOSIS — J309 Allergic rhinitis, unspecified: Secondary | ICD-10-CM

## 2023-08-30 ENCOUNTER — Ambulatory Visit (INDEPENDENT_AMBULATORY_CARE_PROVIDER_SITE_OTHER): Payer: Self-pay

## 2023-08-30 ENCOUNTER — Ambulatory Visit: Admitting: Allergy & Immunology

## 2023-08-30 DIAGNOSIS — J309 Allergic rhinitis, unspecified: Secondary | ICD-10-CM | POA: Diagnosis not present

## 2023-09-04 ENCOUNTER — Other Ambulatory Visit: Payer: Self-pay

## 2023-09-04 ENCOUNTER — Ambulatory Visit: Payer: Self-pay

## 2023-09-04 ENCOUNTER — Ambulatory Visit (INDEPENDENT_AMBULATORY_CARE_PROVIDER_SITE_OTHER): Admitting: Allergy & Immunology

## 2023-09-04 ENCOUNTER — Encounter: Payer: Self-pay | Admitting: Allergy & Immunology

## 2023-09-04 VITALS — BP 102/60 | HR 99 | Temp 97.8°F | Resp 16 | Wt 294.2 lb

## 2023-09-04 DIAGNOSIS — J302 Other seasonal allergic rhinitis: Secondary | ICD-10-CM

## 2023-09-04 DIAGNOSIS — R0602 Shortness of breath: Secondary | ICD-10-CM

## 2023-09-04 DIAGNOSIS — J3089 Other allergic rhinitis: Secondary | ICD-10-CM | POA: Diagnosis not present

## 2023-09-04 DIAGNOSIS — J309 Allergic rhinitis, unspecified: Secondary | ICD-10-CM

## 2023-09-04 DIAGNOSIS — L299 Pruritus, unspecified: Secondary | ICD-10-CM | POA: Diagnosis not present

## 2023-09-04 NOTE — Progress Notes (Signed)
 FOLLOW UP  Date of Service/Encounter:  09/04/23   Assessment:   SOB (shortness of breath) - spiro looked good today   OSA - on CPAP (followed by Dr. Craige Cotta)   Seasonal and perennial allergic rhinitis (grasses, ragweed, weeds, indoor molds, outdoor molds, dust mites, and cat) - doing better on allergen immunotherapy, but not up to maintenance   Chronic back pain   PTSD  Plan/Recommendations:   1. SOB (shortness of breath) - with normal chest X-ray  - Lung testing looks a bit worse today. - But we will follow up with the full pulmonary function testing.  - Continue with Pulmonology at the [facility].   2. Seasonal and perennial allergic rhinitis - with itching - Previous testing showed: grasses, ragweed, weeds, indoor molds, outdoor molds, dust mites, and cat. - Continue with: Astelin (azelastine) 2 sprays per nostril 1-2 times daily as needed.  - Consider nasal saline rinses 1-2 times daily to remove allergens from the nasal cavities as well as help with mucous clearance (this is especially helpful to do before the nasal sprays are given) - We will get you hooked up with someone out there.   3. Pruritis (itching) - This was improved with the allergy shots.   4. We are excited for your new adventure!!    Subjective:   Alexander Gutierrez is a 63 y.o. male presenting today for follow up of  Chief Complaint  Patient presents with   Follow-up    Shortness of breath that comes and goes     Alexander Gutierrez has a history of the following: Patient Active Problem List   Diagnosis Date Noted   Dysphagia 01/24/2022   Melena 01/24/2022   Cirrhosis of liver without ascites (HCC) 01/24/2022   Rectal bleeding 01/24/2022   Nausea without vomiting 01/24/2022   SOB (shortness of breath) 12/07/2021   Seasonal and perennial allergic rhinitis 12/07/2021   OSA on CPAP 07/31/2021   Cyst, dermoid, scalp and neck 06/13/2021   Upper airway cough syndrome 01/02/2021   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  Discussed the use of AI scribe software for clinical note transcription with the patient and/or guardian, who gave verbal consent to proceed.  Alexander Gutierrez is a 63 y.o. male presenting for a follow up visit.  He was last seen in August 2024.  At that time, lung testing looked good.  We continue with his lung treatment with Lung Clear, an alternative medicine treatment that he had been using from a friend of his.  For his allergic rhinitis, we continue with his allergy shots, but recommended remixing them to make them a bit stronger.  We continued with Astelin as well as nasal saline.  Since the last visit, he has done relatively well.   Asthma/Respiratory Symptom History: He has been experiencing persistent breathing issues despite ongoing treatment, including allergy shots, which have not yet provided relief. These breathing issues have been ongoing for over thirty years, with periods of remission lasting up to six months. He recently underwent full pulmonary function testing, with results pending a follow-up with a pulmonologist. He has a history of asthma and is on reflux medication.  Allergic Rhinitis Symptom History: He is experiencing nasal symptoms such as runny nose, sneezing, and postnasal drip, which have improved over the last two weeks. However, significant breathing difficulties persist. No recent eczema flare-ups, with the last occurrence  being in 2015. No food allergies, but he suspects a reaction to walnuts, causing throat itchiness.  Jeryl was on allergen immunotherapy.  His last injection was in June 2024.  He received two injections. Immunotherapy script #1 contains molds and dust mites. He received 0.79mL of the BLUE vial (1/100,000). Immunotherapy script #2 contains  ragweed, weeds, grasses, and cat. He received 0.36mL of the BLUE vial (1/100,000). He  started shots July of 2023 and not yet reached maintenance.  He mentions a recent increase in dizziness and episodes of passing out, which have started in the last two months. He describes getting dizzy every time he takes deep breaths, noting this as a new development.  He has a history of neck pain managed with a peripheral stimulator device targeting the occipital nerve, which has provided about fifty percent relief. He describes the neck pain as causing tightness and congestion, leading to headaches and ear pain. Previous treatments included epidurals, lipoma removal, and ablations, which were not effective.  He is planning to move to [place] in the next two months to be closer to family, including four grandchildren. He has lived in his current location for twenty years and is excited about the [place] [facility] and is currently on a regimen of allergy shots twice a week.   Otherwise, there have been no changes to his past medical history, surgical history, family history, or social history.    Review of systems otherwise negative other than that mentioned in the HPI.    Objective:   Blood pressure 102/60, pulse 99, temperature 97.8 F (36.6 C), resp. rate 16, weight 294 lb 4 oz (133.5 kg), SpO2 95%. Body mass index is 42.22 kg/m.    Physical Exam Vitals reviewed.  Constitutional:      Appearance: He is well-developed.     Comments: Joking.   HENT:     Head: Normocephalic and atraumatic.     Right Ear: Tympanic membrane, ear canal and external ear normal. No drainage, swelling or tenderness. Tympanic membrane is not injected, scarred, erythematous, retracted or bulging.     Left Ear: Tympanic membrane, ear canal and external ear normal. No drainage, swelling or tenderness. Tympanic membrane is not injected, scarred, erythematous, retracted or bulging.     Nose: No nasal deformity, septal  deviation, mucosal edema or rhinorrhea.     Right Turbinates: Enlarged, swollen and pale.     Left Turbinates: Enlarged, swollen and pale.     Right Sinus: No maxillary sinus tenderness or frontal sinus tenderness.     Left Sinus: No maxillary sinus tenderness or frontal sinus tenderness.     Comments: No nasal polyps noted.     Mouth/Throat:     Mouth: Mucous membranes are not pale and not dry.     Pharynx: Uvula midline.  Eyes:     General: Lids are normal. No allergic shiner or visual field deficit.       Right eye: No discharge.        Left eye: No discharge.     Conjunctiva/sclera: Conjunctivae normal.     Right eye: Right conjunctiva is not injected. No chemosis.    Left eye: Left conjunctiva is not injected. No chemosis.    Pupils: Pupils are equal, round, and reactive to light.  Neck:     Thyroid: No thyroid mass or thyroid tenderness.  Cardiovascular:  Rate and Rhythm: Normal rate and regular rhythm.     Heart sounds: Normal heart sounds.  Pulmonary:     Effort: Pulmonary effort is normal. No tachypnea, accessory muscle usage or respiratory distress.     Breath sounds: Normal breath sounds. No wheezing, rhonchi or rales.  Chest:     Chest wall: No tenderness.  Abdominal:     Tenderness: There is no abdominal tenderness. There is no guarding or rebound.  Musculoskeletal:     Cervical back: Full passive range of motion without pain.  Lymphadenopathy:     Head:     Right side of head: No submandibular, tonsillar or occipital adenopathy.     Left side of head: No submandibular, tonsillar or occipital adenopathy.     Cervical: No cervical adenopathy.  Skin:    Coloration: Skin is not pale.     Findings: No abrasion, erythema, petechiae or rash. Rash is not papular, urticarial or vesicular.  Neurological:     Mental Status: He is alert.  Psychiatric:        Behavior: Behavior is cooperative.      Diagnostic studies:    Spirometry: results abnormal (FEV1:  2.48/71%, FVC: 2.85/63%, FEV1/FVC: 87%).    Spirometry consistent with possible restrictive disease.   Allergy Studies: none       Malachi Bonds, MD  [facility]

## 2023-09-04 NOTE — Patient Instructions (Addendum)
 1. SOB (shortness of breath) - with normal chest X-ray  - Lung testing looks a bit worse today. - But we will follow up with the full pulmonary function testing.  - Continue with Pul[facility]ology at the [facility].   2. Seasonal and perennial allergic rhinitis - with itching - Previous testing showed: grasses, ragweed, weeds, indoor molds, outdoor molds, dust mites, and cat. - Continue with: Astelin (azelastine) 2 sprays per nostril 1-2 times daily as needed.  - Consider nasal saline rinses 1-2 times daily to remove allergens from the nasal cavities as well as help with mucous clearance (this is especially helpful to do before the nasal sprays are given) - We will get you hooked up with someone out there.   3. Pruritis (itching) - This was improved with the allergy shots.   4. We are excited for your new adventure!!   Please inform us of any Emergency Department visits, hospitalizations, or changes in symptoms. Call us before going to the ED for breathing or allergy symptoms since we might be able to fit you in for a sick visit. Feel free to contact us anytime with any questions, problems, or concerns.  It was a pleasure to see you again today! GOOD LUCK with the move!   Websites that have reliable patient information: 1. American Academy of Asthma, Allergy, and Immunology: www.aaaai.org 2. Food Allergy Research and Education (FARE): foodallergy.org 3. Mothers of Asthmatics: http://www.asthmacommunitynetwork.org 4. American College of Allergy, Asthma, and Immunology: www.acaai.org      "Like" [place] on Facebook and Instagram for our latest updates!      A healthy democracy works best when [employer] participate! Make sure you are registered to vote! If you have moved or changed any of your contact information, you will need to get this updated before voting! Scan the QR codes below to learn more!

## 2023-09-06 ENCOUNTER — Ambulatory Visit (INDEPENDENT_AMBULATORY_CARE_PROVIDER_SITE_OTHER): Payer: Self-pay

## 2023-09-06 DIAGNOSIS — J309 Allergic rhinitis, unspecified: Secondary | ICD-10-CM

## 2023-09-11 ENCOUNTER — Ambulatory Visit (INDEPENDENT_AMBULATORY_CARE_PROVIDER_SITE_OTHER)

## 2023-09-11 DIAGNOSIS — J309 Allergic rhinitis, unspecified: Secondary | ICD-10-CM | POA: Diagnosis not present

## 2023-09-18 ENCOUNTER — Ambulatory Visit (INDEPENDENT_AMBULATORY_CARE_PROVIDER_SITE_OTHER)

## 2023-09-18 DIAGNOSIS — J309 Allergic rhinitis, unspecified: Secondary | ICD-10-CM

## 2023-09-20 ENCOUNTER — Ambulatory Visit (INDEPENDENT_AMBULATORY_CARE_PROVIDER_SITE_OTHER)

## 2023-09-20 DIAGNOSIS — J309 Allergic rhinitis, unspecified: Secondary | ICD-10-CM | POA: Diagnosis not present

## 2023-09-25 ENCOUNTER — Ambulatory Visit (INDEPENDENT_AMBULATORY_CARE_PROVIDER_SITE_OTHER)

## 2023-09-25 DIAGNOSIS — J309 Allergic rhinitis, unspecified: Secondary | ICD-10-CM

## 2023-10-07 ENCOUNTER — Telehealth: Payer: Self-pay

## 2023-10-07 ENCOUNTER — Ambulatory Visit (INDEPENDENT_AMBULATORY_CARE_PROVIDER_SITE_OTHER): Admitting: Urology

## 2023-10-07 ENCOUNTER — Telehealth: Payer: Self-pay | Admitting: Urology

## 2023-10-07 VITALS — BP 118/73 | HR 92

## 2023-10-07 DIAGNOSIS — N5201 Erectile dysfunction due to arterial insufficiency: Secondary | ICD-10-CM | POA: Diagnosis not present

## 2023-10-07 DIAGNOSIS — R7989 Other specified abnormal findings of blood chemistry: Secondary | ICD-10-CM | POA: Diagnosis not present

## 2023-10-07 LAB — URINALYSIS, ROUTINE W REFLEX MICROSCOPIC
Bilirubin, UA: NEGATIVE
Glucose, UA: NEGATIVE
Ketones, UA: NEGATIVE
Leukocytes,UA: NEGATIVE
Nitrite, UA: NEGATIVE
Protein,UA: NEGATIVE
RBC, UA: NEGATIVE
Specific Gravity, UA: 1.02 (ref 1.005–1.030)
Urobilinogen, Ur: 0.2 mg/dL (ref 0.2–1.0)
pH, UA: 6 (ref 5.0–7.5)

## 2023-10-07 MED ORDER — TADALAFIL 20 MG PO TABS: 20.0000 mg | ORAL_TABLET | Freq: Every day | ORAL | 1 refills | Status: AC | PRN

## 2023-10-07 MED ORDER — SILDENAFIL CITRATE 20 MG PO TABS: ORAL_TABLET | ORAL | 1 refills | Status: DC

## 2023-10-07 NOTE — Telephone Encounter (Signed)
 Patient called both medications need prior auth through the [place] please call the Prior Auth department at [phone]   sildenafil  (REVATIO ) 20 MG tablet  tadalafil  (CIALIS ) 20 MG tablet   Please call him at [phone] when we get those authorizations.

## 2023-10-07 NOTE — Telephone Encounter (Signed)
 PA was completed will call pt when we have approval or denials

## 2023-10-07 NOTE — Telephone Encounter (Signed)
 Medication prior authorization request received.  Completed PA request through cover my meds for drug Tadalafil  . KEY: [id number]  Approved: Pending

## 2023-10-07 NOTE — Telephone Encounter (Signed)
 Medication prior authorization request received.  Completed PA request through cover my meds for drug Sildenafil  (ravatio) KEY: [id number]  Approved: Pending

## 2023-10-07 NOTE — Progress Notes (Unsigned)
 10/07/2023 11:19 AM   Alexander Gutierrez [birth year] [id number]  Referring provider: Brantley Caldwell, NP [address] [place],  [place] [address]  No chief complaint on file.   HPI: New pt -   1) ED - c/o chronic fatigue and ED. Trouble getting and maintaining erection since 2018. Tried sildenafil  and tadalafil . Got a HA and didn't work too well. Orgasm not intense. Also has wt gain. H/o low T in 2015 and he took T gel. He has stage IV liver cirrhosis from [place] exposure.   His Dec 2024 PSA 0.4 and Hct 44.   Today, seen for the above. He considered sparks tabs but they were expensive.   He was in [employer], [employer], Adult nurse.    PMH: Past Medical History:  Diagnosis Date   ADHD    Chronic back pain    Chronic fatigue    Cirrhosis (HCC)    Depression    Erectile dysfunction    GERD (gastroesophageal reflux disease)    Gout    Hypercholesteremia    OSA (obstructive sleep apnea)    PTSD (post-traumatic stress disorder)    Vocal cord dysfunction     Surgical History: Past Surgical History:  Procedure Laterality Date   BIOPSY  03/05/2022   Procedure: BIOPSY;  Surgeon: Vinetta Greening, DO;  [facility];  Service: Endoscopy;;   COLONOSCOPY     COLONOSCOPY WITH PROPOFOL  N/A 03/05/2022   Procedure: COLONOSCOPY WITH PROPOFOL ;  Surgeon: Vinetta Greening, DO;  [facility];  Service: Endoscopy;  Laterality: N/A;  8:30 AM   ESOPHAGOGASTRODUODENOSCOPY (EGD) WITH PROPOFOL  N/A 03/05/2022   Procedure: ESOPHAGOGASTRODUODENOSCOPY (EGD) WITH PROPOFOL ;  Surgeon: Vinetta Greening, DO;  [facility]: [facility];  Service: Endoscopy;  Laterality: N/A;   LARYNGOSCOPY     POLYPECTOMY  03/05/2022   Procedure: POLYPECTOMY;  Surgeon: Vinetta Greening, DO;  [facility]: [facility];  Service: Endoscopy;;    Home Medications:  Allergies as of 10/07/2023   No Known Allergies      Medication List        Accurate as of October 07, 2023 11:19  AM. If you have any questions, ask your nurse or doctor.          albuterol  (2.5 MG/3ML) 0.083% nebulizer solution Commonly known as: PROVENTIL  Take 2.5 mg by nebulization every 6 (six) hours as needed for wheezing or shortness of breath.   allopurinol 100 MG tablet Commonly known as: ZYLOPRIM Take 100 mg by mouth daily.   atorvastatin 20 MG tablet Commonly known as: LIPITOR Take 20 mg by mouth daily.   azelastine  0.1 % nasal spray Commonly known as: ASTELIN  Place 2 sprays into both nostrils 2 (two) times daily.   cetirizine  10 MG tablet Commonly known as: ZYRTEC  Take 1 tablet (10 mg total) by mouth 2 (two) times daily.   clotrimazole 1 % cream Commonly known as: LOTRIMIN APPLY SMALL AMOUNT TO AFFECTED AREA DAILY FOR FEET   colchicine 0.6 MG tablet Take by mouth.   cyanocobalamin 500 MCG tablet Commonly known as: VITAMIN B12 TAKE TWO TABLETS BY MOUTH DAILY FOR B12 SUPPLEMENT   EPINEPHrine  0.3 mg/0.3 mL Soaj injection Commonly known as: EPI-PEN Inject 0.3 mg into the muscle as needed for anaphylaxis.   fluticasone  50 MCG/ACT nasal spray Commonly known as: FLONASE  Place 1-2 sprays in each nostril once a day as needed for stuffy nose. In the right nostril, point the applicator out  toward the right ear. In the left nostril, point the applicator out toward the left ear   furosemide 20 MG tablet Commonly known as: LASIX Take 20 mg by mouth 2 (two) times daily.   gabapentin 300 MG capsule Commonly known as: NEURONTIN Take by mouth.   gabapentin 100 MG capsule Commonly known as: NEURONTIN   HYDROcodone -acetaminophen  5-325 MG tablet Commonly known as: NORCO/VICODIN Take by mouth.   hydrocortisone  2.5 % cream PLACE 1 APPLICATION RECTALLY 2 (TWO) TIMES DAILY.   ibuprofen 600 MG tablet Commonly known as: ADVIL Take 600 mg by mouth every 4 (four) hours as needed for moderate pain. Given for after dental procedure 02/28/22   ketorolac  10 MG tablet Commonly known  as: TORADOL  Take by mouth.   loratadine 10 MG tablet Commonly known as: CLARITIN Take by mouth.   metFORMIN 500 MG tablet Commonly known as: GLUCOPHAGE   omeprazole 40 MG capsule Commonly known as: PRILOSEC Take by mouth.   ondansetron  8 MG disintegrating tablet Commonly known as: ZOFRAN -ODT 8mg  ODT q4 hours prn nausea   pantoprazole  40 MG tablet Commonly known as: Protonix  Take 1 tablet (40 mg total) by mouth 2 (two) times daily before a meal.   pravastatin 20 MG tablet Commonly known as: PRAVACHOL Take 1 tablet by mouth as needed.   propranolol 10 MG tablet Commonly known as: INDERAL Take 10 mg by mouth daily.   sertraline 100 MG tablet Commonly known as: ZOLOFT Take 100 mg by mouth daily.   sildenafil  100 MG tablet Commonly known as: VIAGRA  Take by mouth.   tadalafil  5 MG tablet Commonly known as: CIALIS  Take 5 mg by mouth daily as needed for erectile dysfunction.   tamsulosin 0.4 MG Caps capsule Commonly known as: FLOMAX TAKE ONE CAPSULE BY MOUTH DAILY FOR PROSTATE   triamcinolone  cream 0.1 % Commonly known as: KENALOG  Apply topically.   Vitamin D3 1.25 MG (50000 UT) Tabs Take by mouth.   Wellbutrin XL 300 MG 24 hr tablet Generic drug: buPROPion Take 1 tablet every day by oral route.        Allergies: No Known Allergies  Family History: No family history on file.  Social History:  reports that he has never smoked. He has never used smokeless tobacco. He reports that he does not drink alcohol and does not use drugs.   Physical Exam: BP 118/73   Pulse 92   Constitutional:  Alert and oriented, No acute distress. HEENT: [place] AT, moist mucus membranes.  Trachea midline, no masses. Cardiovascular: No clubbing, cyanosis, or edema. Respiratory: Normal respiratory effort, no increased work of breathing. GI: Abdomen is soft, nontender, nondistended, no abdominal masses GU: No CVA tenderness Skin: No rashes, bruises or suspicious  lesions. Neurologic: Grossly intact, no focal deficits, moving all 4 extremities. Psychiatric: Normal mood and affect.  Laboratory Data: Lab Results  Component Value Date   WBC 6.0 03/15/2023   HGB 14.2 03/15/2023   HCT 40.4 03/15/2023   MCV 87.6 03/15/2023   PLT 204 03/15/2023    Lab Results  Component Value Date   CREATININE 1.14 03/15/2023    No results found for: "PSA"  No results found for: "TESTOSTERONE "  No results found for: "HGBA1C"  Urinalysis    Component Value Date/Time   COLORURINE YELLOW 11/29/2021 1204   APPEARANCEUR CLEAR 11/29/2021 1204   LABSPEC 1.017 11/29/2021 1204   PHURINE 7.0 11/29/2021 1204   GLUCOSEU NEGATIVE 11/29/2021 1204   HGBUR NEGATIVE 11/29/2021 1204   BILIRUBINUR NEGATIVE 11/29/2021  1204   KETONESUR NEGATIVE 11/29/2021 1204   PROTEINUR NEGATIVE 11/29/2021 1204   NITRITE NEGATIVE 11/29/2021 1204   LEUKOCYTESUR NEGATIVE 11/29/2021 1204    No results found for: "LABMICR", "WBCUA", "RBCUA", "LABEPIT", "MUCUS", "BACTERIA"  Pertinent Imaging: N/a    Assessment & Plan:    1) low T - T was sent and discussed follow-up labs if needed. Disc nature r/b/a to T - MACE, DVT, PCa - etc.   2) ED  - Combo, VED, ICI, IPP discussed. He'll try combo for now. I wrote down dosing instructions.   No follow-ups on file.  Christina Coyer, MD  [facility]  [address] [place], [place] [address] [phone]

## 2023-10-09 LAB — TESTOSTERONE, FREE, TOTAL, SHBG
Sex Hormone Binding: 28.2 nmol/L (ref 19.3–76.4)
Testosterone, Free: 2.6 pg/mL — ABNORMAL LOW (ref 6.6–18.1)
Testosterone: 289 ng/dL (ref 264–916)

## 2023-10-11 ENCOUNTER — Ambulatory Visit (INDEPENDENT_AMBULATORY_CARE_PROVIDER_SITE_OTHER): Payer: Self-pay

## 2023-10-11 DIAGNOSIS — J309 Allergic rhinitis, unspecified: Secondary | ICD-10-CM | POA: Diagnosis not present

## 2023-10-16 ENCOUNTER — Ambulatory Visit (INDEPENDENT_AMBULATORY_CARE_PROVIDER_SITE_OTHER)

## 2023-10-16 DIAGNOSIS — J309 Allergic rhinitis, unspecified: Secondary | ICD-10-CM

## 2023-10-22 ENCOUNTER — Emergency Department (HOSPITAL_COMMUNITY)

## 2023-10-22 ENCOUNTER — Emergency Department (HOSPITAL_COMMUNITY)
Admission: EM | Admit: 2023-10-22 | Discharge: 2023-10-22 | Disposition: A | Discharge status: Home / Self Care | Attending: Emergency Medicine | Admitting: Emergency Medicine

## 2023-10-22 ENCOUNTER — Encounter (HOSPITAL_COMMUNITY): Payer: Self-pay

## 2023-10-22 ENCOUNTER — Other Ambulatory Visit: Payer: Self-pay

## 2023-10-22 DIAGNOSIS — R0789 Other chest pain: Secondary | ICD-10-CM | POA: Diagnosis present

## 2023-10-22 DIAGNOSIS — R079 Chest pain, unspecified: Secondary | ICD-10-CM

## 2023-10-22 LAB — CBC WITH DIFFERENTIAL/PLATELET
Abs Immature Granulocytes: 0.02 10*3/uL (ref 0.00–0.07)
Basophils Absolute: 0.1 10*3/uL (ref 0.0–0.1)
Basophils Relative: 1 %
Eosinophils Absolute: 0.2 10*3/uL (ref 0.0–0.5)
Eosinophils Relative: 2 %
HCT: 43.5 % (ref 39.0–52.0)
Hemoglobin: 15.2 g/dL (ref 13.0–17.0)
Immature Granulocytes: 0 %
Lymphocytes Relative: 35 %
Lymphs Abs: 2.4 10*3/uL (ref 0.7–4.0)
MCH: 31 pg (ref 26.0–34.0)
MCHC: 34.9 g/dL (ref 30.0–36.0)
MCV: 88.8 fL (ref 80.0–100.0)
Monocytes Absolute: 0.7 10*3/uL (ref 0.1–1.0)
Monocytes Relative: 10 %
Neutro Abs: 3.5 10*3/uL (ref 1.7–7.7)
Neutrophils Relative %: 52 %
Platelets: 244 10*3/uL (ref 150–400)
RBC: 4.9 MIL/uL (ref 4.22–5.81)
RDW: 12.4 % (ref 11.5–15.5)
WBC: 6.7 10*3/uL (ref 4.0–10.5)
nRBC: 0 % (ref 0.0–0.2)

## 2023-10-22 LAB — COMPREHENSIVE METABOLIC PANEL WITH GFR
ALT: 48 U/L — ABNORMAL HIGH (ref 0–44)
AST: 27 U/L (ref 15–41)
Albumin: 4.2 g/dL (ref 3.5–5.0)
Alkaline Phosphatase: 54 U/L (ref 38–126)
Anion gap: 8 (ref 5–15)
BUN: 20 mg/dL (ref 8–23)
CO2: 26 mmol/L (ref 22–32)
Calcium: 9.2 mg/dL (ref 8.9–10.3)
Chloride: 102 mmol/L (ref 98–111)
Creatinine, Ser: 1.23 mg/dL (ref 0.61–1.24)
GFR, Estimated: >60 mL/min (ref >60)
Glucose, Bld: 106 mg/dL — ABNORMAL HIGH (ref 70–99)
Potassium: 4.1 mmol/L (ref 3.5–5.1)
Sodium: 136 mmol/L (ref 135–145)
Total Bilirubin: 0.5 mg/dL (ref 0.0–1.2)
Total Protein: 7 g/dL (ref 6.5–8.1)

## 2023-10-22 LAB — TROPONIN I (HIGH SENSITIVITY)
Troponin I (High Sensitivity): 3 ng/L (ref <18)
Troponin I (High Sensitivity): 3 ng/L (ref <18)

## 2023-10-22 MED ORDER — IOHEXOL 350 MG/ML SOLN
100.0000 mL | Freq: Once | INTRAVENOUS | Status: AC | PRN
  Administered 2023-10-22: 100 mL via INTRAVENOUS

## 2023-10-22 MED ORDER — OXYCODONE HCL 5 MG PO TABS: 5.0000 mg | ORAL_TABLET | Freq: Four times a day (QID) | ORAL | 0 refills | Status: AC | PRN

## 2023-10-22 MED ORDER — MELOXICAM 7.5 MG PO TABS: 7.5000 mg | ORAL_TABLET | Freq: Every day | ORAL | 0 refills | Status: AC

## 2023-10-22 MED ORDER — KETOROLAC TROMETHAMINE 15 MG/ML IJ SOLN
15.0000 mg | Freq: Once | INTRAMUSCULAR | Status: AC
Start: 2023-10-22 — End: 2023-10-22
  Administered 2023-10-22: 15 mg via INTRAVENOUS
  Filled 2023-10-22: qty 1

## 2023-10-22 MED ORDER — MORPHINE SULFATE (PF) 4 MG/ML IV SOLN
4.0000 mg | Freq: Once | INTRAVENOUS | Status: AC
  Administered 2023-10-22: 4 mg via INTRAVENOUS
  Filled 2023-10-22: qty 1

## 2023-10-22 MED ORDER — FENTANYL CITRATE PF 50 MCG/ML IJ SOSY
25.0000 ug | PREFILLED_SYRINGE | Freq: Once | INTRAMUSCULAR | Status: AC
  Administered 2023-10-22: 25 ug via INTRAVENOUS
  Filled 2023-10-22: qty 1

## 2023-10-22 NOTE — ED Provider Notes (Signed)
 [place] EMERGENCY DEPARTMENT AT [facility] Provider Note   CSN: [id number] Arrival date & time: 10/22/23  1324     History  Chief Complaint  Patient presents with   Chest Pain    Alexander Gutierrez is a 63 y.o. male.  History of high cholesterol, gout, sleep apnea, GERD, cirrhosis.  Presents ER today with plaints of chest pain on the left side that is sharp and pulsing, nonradiating.  It woke him from sleep at approximately 2 AM and has been intermittently painful all day but the pain is getting more frequent and he felt clammy which prompted him to come to the ER.  Denies syncope, no exertional worsening of his symptoms.  Pain is not positional is not reproducible with movement or palpation.  He states that it does not feel like his typical GERD.  He has no back pain.  No dizziness, no shortness of breath.   Chest Pain      Home Medications Prior to Admission medications   Medication Sig Start Date End Date Taking? Authorizing Provider  allopurinol (ZYLOPRIM) 300 MG tablet Take 300 mg by mouth daily.   Yes [provider]  atorvastatin (LIPITOR) 20 MG tablet Take 20 mg by mouth daily.   Yes [provider]  azelastine  (ASTELIN ) 0.1 % nasal spray Place 2 sprays into both nostrils 2 (two) times daily. 02/13/23 10/22/23 Yes Rochester Chuck, MD  buPROPion (WELLBUTRIN XL) 150 MG 24 hr tablet Take 150 mg by mouth daily. 08/23/23  Yes [provider]  Cholecalciferol (VITAMIN D3) 1.25 MG (50000 UT) TABS Take 1 tablet by mouth daily.   Yes [provider]  clotrimazole (LOTRIMIN) 1 % cream Apply 1 Application topically daily. To feet 08/14/22  Yes [provider]  colchicine 0.6 MG tablet Take 0.6 mg by mouth daily as needed (gout flare ups). 11/29/21  Yes [provider]  cyanocobalamin (VITAMIN B12) 500 MCG tablet Take 500 mcg by mouth daily. 05/20/22  Yes [provider]  EPINEPHrine  0.3 mg/0.3 mL IJ SOAJ injection  Inject 0.3 mg into the muscle as needed for anaphylaxis. 04/24/23  Yes Rochester Chuck, MD  escitalopram (LEXAPRO) 10 MG tablet Take 10 mg by mouth daily.   Yes [provider]  furosemide (LASIX) 20 MG tablet Take 10 mg by mouth daily. 02/28/21  Yes [provider]  loratadine (CLARITIN) 10 MG tablet Take 10 mg by mouth daily. 05/20/22  Yes [provider]  meloxicam (MOBIC) 7.5 MG tablet Take 1 tablet (7.5 mg total) by mouth daily. 10/22/23  Yes Vallen Calabrese A, PA-C  omeprazole (PRILOSEC) 40 MG capsule Take 40 mg by mouth daily.   Yes [provider]  oxyCODONE  (ROXICODONE ) 5 MG immediate release tablet Take 1 tablet (5 mg total) by mouth every 6 (six) hours as needed for severe pain (pain score 7-10). 10/22/23  Yes Allissa Albright A, PA-C  propranolol (INDERAL) 10 MG tablet Take 10 mg by mouth daily as needed (PTSD).   Yes [provider]  sodium chloride  (OCEAN) 0.65 % SOLN nasal spray Place 1 spray into both nostrils as needed for congestion.   Yes [provider]  tadalafil  (CIALIS ) 20 MG tablet Take 1 tablet (20 mg total) by mouth daily as needed for erectile dysfunction. 10/07/23  Yes Christina Coyer, MD  tadalafil  (CIALIS ) 5 MG tablet Take 5 mg by mouth daily as needed for erectile dysfunction.   Yes [provider]  tamsulosin (FLOMAX) 0.4  MG CAPS capsule Take 0.4 mg by mouth daily after breakfast. 05/20/22  Yes [provider]  triamcinolone  cream (KENALOG ) 0.1 % Apply 1 Application topically 2 (two) times daily as needed (itching). 12/06/21  Yes [provider]  doxycycline (ADOXA) 100 MG tablet Take 100 mg by mouth 2 (two) times daily.    [provider]      Allergies    Cat dander, Liraglutide, Mixed grasses, and Tioconazole    Review of Systems   Review of Systems  Cardiovascular:  Positive for chest pain.    Physical Exam Updated Vital Signs BP 127/70   Pulse 74   Temp (!) 97.5 F  (36.4 C) (Oral)   Resp 14   Ht 5\' 10"  (1.778 m)   Wt 133.5 kg   SpO2 94%   BMI 42.23 kg/m  Physical Exam Vitals and nursing note reviewed.  Constitutional:      General: He is not in acute distress.    Appearance: He is well-developed.  HENT:     Head: Normocephalic and atraumatic.  Eyes:     Conjunctiva/sclera: Conjunctivae normal.  Cardiovascular:     Rate and Rhythm: Normal rate and regular rhythm.     Heart sounds: No murmur heard. Pulmonary:     Effort: Pulmonary effort is normal. No respiratory distress.     Breath sounds: Normal breath sounds.  Abdominal:     Palpations: Abdomen is soft.     Tenderness: There is no abdominal tenderness.  Musculoskeletal:        General: No swelling. Normal range of motion.     Cervical back: Neck supple.  Skin:    General: Skin is warm and dry.     Capillary Refill: Capillary refill takes less than 2 seconds.  Neurological:     General: No focal deficit present.     Mental Status: He is alert and oriented to person, place, and time.  Psychiatric:        Mood and Affect: Mood normal.     ED Results / Procedures / Treatments   Labs (all labs ordered are listed, but only abnormal results are displayed) Labs Reviewed  COMPREHENSIVE METABOLIC PANEL WITH GFR - Abnormal; Notable for the following components:      Result Value   Glucose, Bld 106 (*)    ALT 48 (*)    All other components within normal limits  CBC WITH DIFFERENTIAL/PLATELET  TROPONIN I (HIGH SENSITIVITY)  TROPONIN I (HIGH SENSITIVITY)    EKG EKG Interpretation Date/Time:  Tuesday Oct 22 2023 13:31:00 EDT Ventricular Rate:  80 PR Interval:  182 QRS Duration:  100 QT Interval:  380 QTC Calculation: 438 R Axis:   -81  Text Interpretation: Normal sinus rhythm Left anterior fascicular block Cannot rule out Anterior infarct (cited on or before 01-Mar-2022) Abnormal ECG Confirmed by Eldon Greenland ([id number]) on 10/22/2023 1:38:10 PM  Radiology CT Angio Chest  Aorta W and/or Wo Contrast Result Date: 10/22/2023 CLINICAL DATA:  Chest pain.  Evaluate for acute aortic syndrome EXAM: CT ANGIOGRAPHY CHEST WITH CONTRAST TECHNIQUE: Multidetector CT imaging of the chest was performed using the standard protocol during bolus administration of intravenous contrast. Multiplanar CT image reconstructions and MIPs were obtained to evaluate the vascular anatomy. RADIATION DOSE REDUCTION: This exam was performed according to the departmental dose-optimization program which includes automated exposure control, adjustment of the mA and/or kV according to patient size and/or use of iterative reconstruction technique. CONTRAST:  OMNIPAQUE  IOHEXOL  350 MG/ML  SOLN COMPARISON:  Plain film earlier today.  CTA chest 05/16/2020 FINDINGS: Cardiovascular: Aortic atherosclerosis. Normal aortic caliber. Given motion limitations detailed below, no dissection. Normal heart size, without pericardial effusion. Pulmonary arteries not well opacified secondary to bolus timing. Mediastinum/Nodes: No mediastinal or hilar adenopathy. Lungs/Pleura: No pleural fluid. Mild to moderate motion degradation throughout the upper and mid chest. 4 mm subpleural right lower lobe pulmonary nodule on 95/7 is unchanged and considered benign. Upper Abdomen: Moderate hepatic steatosis. Segment 2 1.2 cm hepatic cyst. Normal imaged portions of the spleen, stomach, pancreas, gallbladder, adrenal glands, kidneys. Musculoskeletal: No acute osseous abnormality. Review of the MIP images confirms the above findings. IMPRESSION: 1. Mild to moderate motion degradation. 2. Given this limitation, no aortic aneurysm or dissection. No explanation for chest pain. 3. Hepatic steatosis 4.  Aortic Atherosclerosis (ICD10-I70.0). Electronically Signed   By: Lore Rode M.D.   On: 10/22/2023 17:25   DG Chest 2 View Result Date: 10/22/2023 CLINICAL DATA:  Chest pain. EXAM: CHEST - 2 VIEW COMPARISON:  12/08/2021. FINDINGS: Bilateral lung  fields are clear. Bilateral costophrenic angles are clear. Normal cardio-mediastinal silhouette. No acute osseous abnormalities. The soft tissues are within normal limits. IMPRESSION: No active cardiopulmonary disease. Electronically Signed   By: Beula Brunswick M.D.   On: 10/22/2023 14:21    Procedures Procedures    Medications Ordered in ED Medications  fentaNYL (SUBLIMAZE) injection 25 mcg (25 mcg Intravenous Given 10/22/23 1429)  ketorolac  (TORADOL ) 15 MG/ML injection 15 mg (15 mg Intravenous Given 10/22/23 1547)  morphine (PF) 4 MG/ML injection 4 mg (4 mg Intravenous Given 10/22/23 1642)  iohexol  (OMNIPAQUE ) 350 MG/ML injection 100 mL (100 mLs Intravenous Contrast Given 10/22/23 1658)    ED Course/ Medical Decision Making/ A&P                                 Medical Decision Making The patient presented today for chest pain that had started at 2 am. EKG showed NSR with no ST or T wave changes, Chest Xray was independently reviewed by me and shows No pulmonary edema or infiltrate. I agree with radiology interpretation.   They were given toradol  for their symptoms, and on repeat evaluation their symptoms are not changed, also given fentanyl without improvement, pain completely resolved with morphine however  I considered a broad differential including but not limited to ACS, PE, Dissection, pneumothorax, costochondritis, pneumonia, GERD, pericarditis, and pericardial effusion.  PE is considered but this is low risk, patient not tachycardic, tachypneic or hypoxic, he has no pleurisy  I feel disssection is likely but did get a CTA to rule this out given persistent pain and shows has no dissection  Symptoms and EKG are not consistent with pericarditis or pericardial effusion  Their heart score is a. Troponin is negative x 2  Plan is for discharge home, follow-up outpatient with cardiology, referral was placed.  Patient was given strict return precautions.     Amount and/or Complexity of  Data Reviewed Labs: ordered. Radiology: ordered.  Risk Prescription drug management.           Final Clinical Impression(s) / ED Diagnoses Final diagnoses:  Chest pain, unspecified type    Rx / DC Orders ED Discharge Orders          Ordered    Ambulatory referral to Cardiology       Comments: If you have not heard from the Cardiology office within  the next 72 hours please call [phone].   10/22/23 1819    meloxicam (MOBIC) 7.5 MG tablet  Daily        10/22/23 1820    oxyCODONE  (ROXICODONE ) 5 MG immediate release tablet  Every 6 hours PRN        10/22/23 1822              Joshua Nieves 10/22/23 Lonni Robert, MD 10/24/23 [phone]

## 2023-10-22 NOTE — Discharge Instructions (Signed)
 You were seen in the emergency room today for chest pain.  Fortunately your workup was reassuring.  You checked your blood work including cardiac enzymes to rule out a heart attack, and we did a CT scan to rule out an aneurysm.  Is no sign of pneumonia or collapsed lung or other serious cause of chest pain today.  Your EKG was normal.  Please follow-up with cardiology and your PCP.  Come back to the ER for new or worsening symptoms.  You can take the Mobic as prescribed for pain.  You can also take over-the-counter Tylenol  as directed on packaging.  If pain is more severe you can take the oxycodone .

## 2023-10-22 NOTE — ED Triage Notes (Signed)
 Pt arrived via POV c/o left chest pain that began this morning. Pt reports feeling cold and clammy. Denies injury. Pt reports [facility] and being advised to seek evaluation I local ER.

## 2023-10-25 ENCOUNTER — Ambulatory Visit (INDEPENDENT_AMBULATORY_CARE_PROVIDER_SITE_OTHER)

## 2023-10-25 ENCOUNTER — Telehealth: Payer: Self-pay

## 2023-10-25 DIAGNOSIS — J309 Allergic rhinitis, unspecified: Secondary | ICD-10-CM | POA: Diagnosis not present

## 2023-10-25 DIAGNOSIS — R7989 Other specified abnormal findings of blood chemistry: Secondary | ICD-10-CM

## 2023-10-25 NOTE — Telephone Encounter (Signed)
-----   Message from Christina Coyer sent at 10/24/2023  9:34 PM EDT ----- Jenelle Mis his T is low. Can you order a LH, estradiol, testosterone  and prolactin for one morning in about 3 weeks. Thank you ! ----- Message ----- From: Dyke Glasser, CMA Sent: 10/09/2023   7:57 AM EDT To: Christina Coyer, MD  Please review.

## 2023-10-25 NOTE — Telephone Encounter (Signed)
 Called pt to relay message from MD (see previous encounter) pt did not answer left message stating I would send a mychart message pt has an active mychart

## 2023-11-06 ENCOUNTER — Ambulatory Visit (INDEPENDENT_AMBULATORY_CARE_PROVIDER_SITE_OTHER)

## 2023-11-06 DIAGNOSIS — J309 Allergic rhinitis, unspecified: Secondary | ICD-10-CM | POA: Diagnosis not present

## 2023-11-07 ENCOUNTER — Other Ambulatory Visit

## 2023-11-07 DIAGNOSIS — R7989 Other specified abnormal findings of blood chemistry: Secondary | ICD-10-CM

## 2023-11-08 ENCOUNTER — Ambulatory Visit: Payer: Self-pay

## 2023-11-08 LAB — ESTRADIOL: Estradiol: 19.5 pg/mL (ref 7.6–42.6)

## 2023-11-08 LAB — TESTOSTERONE: Testosterone: 160 ng/dL — ABNORMAL LOW (ref 264–916)

## 2023-11-08 LAB — LUTEINIZING HORMONE: LH: 6 m[IU]/mL (ref 1.7–8.6)

## 2023-11-08 LAB — PROLACTIN: Prolactin: 7.5 ng/mL (ref 3.6–25.2)

## 2023-11-20 ENCOUNTER — Ambulatory Visit (INDEPENDENT_AMBULATORY_CARE_PROVIDER_SITE_OTHER)

## 2023-11-20 DIAGNOSIS — J309 Allergic rhinitis, unspecified: Secondary | ICD-10-CM | POA: Diagnosis not present

## 2023-11-20 NOTE — Progress Notes (Signed)
 VIALS MADE 11-20-23

## 2023-11-21 DIAGNOSIS — J3081 Allergic rhinitis due to animal (cat) (dog) hair and dander: Secondary | ICD-10-CM | POA: Diagnosis not present

## 2023-11-22 DIAGNOSIS — J3089 Other allergic rhinitis: Secondary | ICD-10-CM | POA: Diagnosis not present

## 2023-12-02 ENCOUNTER — Telehealth: Payer: Self-pay

## 2023-12-02 NOTE — Telephone Encounter (Signed)
 Called and spoke to patient to discuss his vials being transferred to [place]  where he is currently moving to. Patient expressed that he is currently in route to [place]  now and will call our office back to confirm that his new PCP got the vial transfer papers.

## 2023-12-12 ENCOUNTER — Other Ambulatory Visit

## 2023-12-17 ENCOUNTER — Telehealth: Payer: Self-pay

## 2023-12-17 NOTE — Telephone Encounter (Signed)
 Called pt and left detailed message of lab results from MD E[place]ridge see previous encounter for details advised pt to call back with needed answers concerning his testosterone 

## 2024-01-06 ENCOUNTER — Ambulatory Visit: Admitting: Urology
